# Patient Record
Sex: Female | Born: 1970
Health system: Southern US, Community
[De-identification: ages and names within clinical notes are randomized; demographics above are authoritative.]

## PROBLEM LIST (undated history)

## (undated) DIAGNOSIS — J40 Bronchitis, not specified as acute or chronic: Secondary | ICD-10-CM

## (undated) DIAGNOSIS — J45909 Unspecified asthma, uncomplicated: Secondary | ICD-10-CM

## (undated) DIAGNOSIS — E785 Hyperlipidemia, unspecified: Secondary | ICD-10-CM

## (undated) DIAGNOSIS — E041 Nontoxic single thyroid nodule: Secondary | ICD-10-CM

## (undated) HISTORY — DX: Nontoxic single thyroid nodule: E04.1

## (undated) HISTORY — DX: Hyperlipidemia, unspecified: E78.5

## (undated) HISTORY — DX: Bronchitis, not specified as acute or chronic: J40

## (undated) HISTORY — DX: Unspecified asthma, uncomplicated: J45.909

---

## 2001-01-10 HISTORY — PX: CHOLECYSTECTOMY: SHX55

## 2012-03-08 LAB — HM PAP SMEAR

## 2013-02-08 LAB — HM PAP SMEAR: HM Pap smear: NORMAL

## 2013-03-06 LAB — HEPATIC FUNCTION PANEL
ALK PHOS: 85 U/L (ref 25–125)
ALT: 18 U/L (ref 7–35)
AST: 19 U/L (ref 13–35)

## 2013-03-06 LAB — CBC AND DIFFERENTIAL
HEMOGLOBIN: 13.4 g/dL (ref 12.0–16.0)
Platelets: 310 10*3/uL (ref 150–399)
WBC: 7.8 10*3/mL

## 2013-03-06 LAB — LIPID PANEL
Cholesterol: 202 mg/dL — AB (ref 0–200)
HDL: 52 mg/dL (ref 35–70)
LDL Cholesterol: 139 mg/dL
TRIGLYCERIDES: 57 mg/dL (ref 40–160)

## 2013-03-06 LAB — BASIC METABOLIC PANEL
BUN: 14 mg/dL (ref 4–21)
Creatinine: 0.9 mg/dL (ref 0.5–1.1)
Glucose: 92 mg/dL
Potassium: 4.5 mmol/L (ref 3.4–5.3)
Sodium: 145 mmol/L (ref 137–147)

## 2013-07-03 DIAGNOSIS — Z8249 Family history of ischemic heart disease and other diseases of the circulatory system: Secondary | ICD-10-CM | POA: Insufficient documentation

## 2013-12-10 ENCOUNTER — Emergency Department
Admission: EM | Admit: 2013-12-10 | Discharge: 2013-12-10 | Disposition: A | Discharge status: Other Acute Inpatient Hospital | Payer: BC Managed Care – PPO | Source: Home / Self Care | Attending: Emergency Medicine | Admitting: Emergency Medicine

## 2013-12-10 DIAGNOSIS — G4453 Primary thunderclap headache: Secondary | ICD-10-CM

## 2013-12-10 NOTE — ED Provider Notes (Signed)
CSN: [id number]     Arrival date & time 12/10/13  2008 History   None    Chief Complaint  Patient presents with  . Headache   [facility] at 7:59 PM Patient is a 43 y.o. female presenting with headaches. The history is provided by the patient and the spouse.  Headache Pain location:  Generalized (And occipital) Quality:  Sharp (With deep pressure sensation diffusely) Radiates to:  Does not radiate Severity currently:  10/10 Onset quality:  Sudden Duration:  8 hours Timing:  Constant Progression:  Worsening Chronicity:  New Similar to prior headaches: no (She states this is the worst headache of her life)   Context: not exposure to bright light   Context comment:  Recalls no injury Ineffective treatments: Ibuprofen 800 mg. Associated symptoms: blurred vision (Right eye, intermittently), dizziness, eye pain, numbness and visual change   Associated symptoms: no abdominal pain, no ear pain, no fever, no focal weakness, no seizures, no sore throat, no syncope and no vomiting    She denies history of migraines or chronic headaches. She states this is the worst headache of her life, and she is very worried about this being something severe.   No past medical history on file. No past surgical history on file. No family history on file. History  Substance Use Topics  . Smoking status: Not on file  . Smokeless tobacco: Not on file  . Alcohol Use: Not on file   denies tobacco or alcohol use OB History    No data available     Review of Systems  Constitutional: Negative for fever.  HENT: Negative for ear pain and sore throat.   Eyes: Positive for blurred vision (Right eye, intermittently) and pain.  Cardiovascular: Negative for syncope.  Gastrointestinal: Negative for vomiting and abdominal pain.  Neurological: Positive for dizziness, numbness and headaches. Negative for focal weakness and seizures.    Allergies  Codeine phosphate  Home  Medications   Prior to Admission medications   Not on File   BP 128/80 mmHg  Pulse 96  Temp(Src) 97.6 F (36.4 C) (Oral)  Wt 153 lb (69.4 kg)  SpO2 100% Physical Exam  Constitutional: She is oriented to person, place, and time. She appears well-developed and well-nourished. She appears distressed (From severe headache).  She is alert. She is in severe pain from headache and appears very worried. She can ambulate on her own. Here with husband  HENT:  Head: Normocephalic and atraumatic.  Mouth/Throat: Oropharynx is clear and moist.  Head without any specific point tenderness that she points to her entire head and especially occipital area which has the severe pain.  Eyes: Pupils are equal, round, and reactive to light. No scleral icterus.  Neck: Normal range of motion. Neck supple.  Cardiovascular: Normal rate and regular rhythm.   Pulmonary/Chest: Effort normal.  Abdominal: She exhibits no distension.  Musculoskeletal: Normal range of motion. She exhibits no edema.  Neurological: She is alert and oriented to person, place, and time. No cranial nerve deficit.  Skin: Skin is warm and dry. No rash noted.  Psychiatric: She has a normal mood and affect.   Vision each eye is grossly intact. DTRs upper extremities and sensation upper extremities grossly intact and equal ED Course  Procedures (including critical care time) Labs Review Labs Reviewed - No data to display  Imaging Review No results found.   MDM   1. Primary thunderclap headache    unknown cause for severe  acute headache today, the worst headache of her life, without prior history of chronic headaches or migraines. Associated with intermittent vision abnormality and lightheadedness.  Need to rule out intracranial bleed or other acute intracranial abnormality. I explained this to patient and husband. I explained that we don't have CT available here in urgent care at this time, and she likely will need some type of stat  head imaging tonight, such as CT of head, to rule out acute intracranial abnormality.  Per my advice, husband to drive patient to the nearest emergency room (which [facility]) now for further evaluation and treatment. Patient and husband voiced understanding and agreement with these plans.  We called ahead and spoke to the triage nurse at [facility] and explained the situation and the above concerns, and the need for stat evaluation and treatment there.  Lajean Manesavid Massey, MD 12/10/13 [id number]

## 2013-12-10 NOTE — ED Notes (Signed)
Patient c/o headache that started today approx 12 o'clock. States that she took ibuprofen 800 mg today at 6:15 pm and it has not helped

## 2013-12-12 ENCOUNTER — Telehealth: Payer: Self-pay | Admitting: Emergency Medicine

## 2014-02-10 LAB — HM MAMMOGRAPHY: HM Mammogram: NORMAL

## 2014-03-12 LAB — HM PAP SMEAR

## 2014-04-03 LAB — CHG IRON BINDING TEST: Total Iron Binding Capacity: 369

## 2014-04-03 LAB — IRON: Iron: 90

## 2014-04-03 LAB — CBC AND DIFFERENTIAL
HEMOGLOBIN: 12.2 g/dL (ref 12.0–16.0)
PLATELETS: 337 10*3/uL (ref 150–399)
WBC: 8 10*3/mL

## 2014-04-03 LAB — FERRITIN: FERRITIN: 25

## 2014-04-03 LAB — TRANSFERRIN: TRANSFERRIN: 296

## 2014-04-21 ENCOUNTER — Telehealth: Payer: Self-pay | Admitting: Family Medicine

## 2014-04-21 NOTE — Telephone Encounter (Signed)
FYI. Pt's daughter  will be coming in next year, she is currently being seen by her Pediatric doctor. Dr Linford ArnoldMetheney has given the ok.

## 2014-06-05 ENCOUNTER — Encounter: Payer: Self-pay | Admitting: Family Medicine

## 2014-06-05 ENCOUNTER — Ambulatory Visit (INDEPENDENT_AMBULATORY_CARE_PROVIDER_SITE_OTHER): Payer: BLUE CROSS/BLUE SHIELD | Admitting: Family Medicine

## 2014-06-05 VITALS — BP 107/65 | HR 89 | Ht 62.0 in | Wt 139.0 lb

## 2014-06-05 DIAGNOSIS — G4700 Insomnia, unspecified: Secondary | ICD-10-CM | POA: Diagnosis not present

## 2014-06-05 DIAGNOSIS — H9312 Tinnitus, left ear: Secondary | ICD-10-CM

## 2014-06-05 DIAGNOSIS — D509 Iron deficiency anemia, unspecified: Secondary | ICD-10-CM | POA: Insufficient documentation

## 2014-06-05 DIAGNOSIS — H9319 Tinnitus, unspecified ear: Secondary | ICD-10-CM | POA: Insufficient documentation

## 2014-06-05 DIAGNOSIS — E049 Nontoxic goiter, unspecified: Secondary | ICD-10-CM

## 2014-06-05 NOTE — Progress Notes (Signed)
Subjective:    Patient ID: Sheryl Taylor, female    DOB: [birth year], 44 y.o.   MRN: [id number]  HPI  patient is a 44 year old female with no significant past medical history who is a Geologist, engineering for the [employer]. She comes in today to establish care. She does not take any prescription medications. She does see a GYN and reports that her mammogram and Pap smear are up-to-date. She also reports that she had blood work that included a cholesterol in March of this year.   she comes in today complaining of a sore throat that started on Monday. She says is actually been a little bit better today. She did not have any other cold symptoms with it but did notice a lymph node that was swollen on the right side of the neck. She came in because she just wanted to make sure that it was okay. She's had ongoing issues with ear ringing since November. She is Re: Been to the audiologist. They told her that there was nothing really they can do to correct it. About a week or so ago she decided to start a B complex. A friend had told her that it might help with her ear ringing. She said she was doing and took a until a few days ago when she felt like ear was bothering her more. Instead of being more of a constant ringing it sounds more like a pulse or throbbing. She denies any recent onset of pain or drainage or fever.  Review of Systems  Constitutional: Negative for fever, diaphoresis and unexpected weight change.  HENT: Positive for tinnitus. Negative for hearing loss and rhinorrhea.   Eyes: Negative for visual disturbance.  Respiratory: Negative for cough and wheezing.   Cardiovascular: Negative for chest pain and palpitations.  Gastrointestinal: Negative for nausea, vomiting, diarrhea and blood in stool.  Genitourinary: Negative for vaginal bleeding, vaginal discharge and difficulty urinating.  Musculoskeletal: Negative for myalgias and arthralgias.  Skin: Negative for rash.   Neurological: Negative for headaches.  Hematological: Negative for adenopathy. Does not bruise/bleed easily.  Psychiatric/Behavioral: Negative for sleep disturbance and dysphoric mood. The patient is not nervous/anxious.    BP 107/65 mmHg  Pulse 89  Ht  (1.575 m)  Wt 139 lb (63.05 kg)  BMI 25.42 kg/m2    Allergies  Allergen Reactions  . Codeine Phosphate Itching  . Morphine And Related Swelling and Rash    No past medical history on file.  Past Surgical History  Procedure Laterality Date  . Cesarean section      x 2   . Cholecystectomy  2003    History   Social History  . Marital Status: Married    Spouse Name: Karleen Hampshire   . Number of Children: 2  . Years of Education: N/A   Occupational History  . teacher assistant     [employer]   Social History Main Topics  . Smoking status: Never Smoker   . Smokeless tobacco: Not on file  . Alcohol Use: No  . Drug Use: No  . Sexual Activity:    Partners: Male   Other Topics Concern  . 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 And has two children. She exercises 2 days a wk for an 1 hour.  Her sister died suddenty at 32 unknown cause and this makes her very anxious.  Family History  Problem Relation Age of Onset  . Lung cancer Father     lung (50)  . Heart failure Father     (41)  . Hyperlipidemia Father   . Breast cancer      great aunt  . Breast cancer      cousin.     Outpatient Encounter Prescriptions as of 06/05/2014  Medication Sig  . Vitamins-Lipotropics (LIPO-FLAVONOID PLUS PO) Take by mouth.   No facility-administered encounter medications on file as of 06/05/2014.           Objective:   Physical Exam  Constitutional: She is oriented to person, place, and time. She appears well-developed and well-nourished.  HENT:  Head: Normocephalic and atraumatic.  Right Ear: External ear normal.  Left Ear: External ear normal.   Nose: Nose normal.  Mouth/Throat: Oropharynx is clear and moist.  TMs and canals are clear. The light reflex is a little bit distorted on the left tympanic membrane. Otherwise normal. No sign of fluid erythema or drainage.  Eyes: Conjunctivae and EOM are normal. Pupils are equal, round, and reactive to light.  Neck: Neck supple. No thyromegaly present.  She does have a slightly enlarged anterior cervical lymph node on the right approximately 1 cm in size. It is smooth and firm but not hard.  Cardiovascular: Normal rate, regular rhythm and normal heart sounds.   Pulmonary/Chest: Effort normal and breath sounds normal. She has no wheezes.  Lymphadenopathy:    She has cervical adenopathy.  Neurological: She is alert and oriented to person, place, and time.  Skin: Skin is warm and dry.  Psychiatric: She has a normal mood and affect.          Assessment & Plan:  Tinnitus chronic but with change in type of tinnitus - tympanometry shows some negative pressure in the right ear which is the side where she has a swollen lymph node and a widened tympanogram on the left ear which is the side that she has an abnormal light reflex. We discussed treatment options. Since I see no sign of actual infection on exam I recommended nasal Sterritt spray which should help with the negative pressure in the right ear and possible effusion in the left ear. She has tried a Paediatric nursenasal Sterritt spray previously but didn't stick with it because she was worried that it might make her ear worse. I encouraged her to get back on it for a month. If she still having the change in the tinnitus in the left ear after a week on the nasal Sterritt spray the encourage her to come back in and let me just recheck the ear.  Right ant cerv LN - gave reassurance. Likely viral since had ST earlier this week. Should resolve within a month. Call if persists longer than that  Borderline thyromegaly.  We could certainly pursue this with an  ultrasound if she would like. We'll hold off now because of finances but could consider in the future. Just encourage her to keep an eye on the area.  Iron deficiency anemia-she's been on One-A-Day iron for several months and is due to repeat her level in June. She said she thought her last level was up to 25.  Offered tetanus vaccine but she declined today and said she will think about it in the future.

## 2014-06-06 ENCOUNTER — Ambulatory Visit: Payer: Self-pay | Admitting: Family Medicine

## 2014-06-12 ENCOUNTER — Ambulatory Visit (INDEPENDENT_AMBULATORY_CARE_PROVIDER_SITE_OTHER): Payer: BLUE CROSS/BLUE SHIELD

## 2014-06-12 DIAGNOSIS — E049 Nontoxic goiter, unspecified: Secondary | ICD-10-CM | POA: Diagnosis not present

## 2014-06-13 ENCOUNTER — Encounter: Payer: Self-pay | Admitting: Family Medicine

## 2014-06-13 ENCOUNTER — Other Ambulatory Visit: Payer: Self-pay | Admitting: *Deleted

## 2014-06-13 DIAGNOSIS — E049 Nontoxic goiter, unspecified: Secondary | ICD-10-CM

## 2014-06-13 LAB — TSH: TSH: 2.017 u[IU]/mL (ref 0.350–4.500)

## 2014-06-13 LAB — T4, FREE: Free T4: 0.94 ng/dL (ref 0.80–1.80)

## 2014-07-03 ENCOUNTER — Telehealth: Payer: Self-pay | Admitting: *Deleted

## 2014-07-03 ENCOUNTER — Other Ambulatory Visit: Payer: Self-pay | Admitting: *Deleted

## 2014-07-03 DIAGNOSIS — Z0000 Encounter for general adult medical examination without abnormal findings: Secondary | ICD-10-CM

## 2014-07-03 DIAGNOSIS — Z114 Encounter for screening for human immunodeficiency virus [HIV]: Secondary | ICD-10-CM

## 2014-07-03 NOTE — Telephone Encounter (Signed)
Pt called requesting that lab orders be placed prior to her CPE. Orders placed and faxed.Loralee Pacas [place]

## 2014-07-11 LAB — COMPLETE METABOLIC PANEL WITH GFR
ALK PHOS: 61 U/L (ref 39–117)
ALT: 12 U/L (ref 0–35)
AST: 14 U/L (ref 0–37)
Albumin: 4.1 g/dL (ref 3.5–5.2)
BILIRUBIN TOTAL: 0.5 mg/dL (ref 0.2–1.2)
BUN: 10 mg/dL (ref 6–23)
CALCIUM: 9.1 mg/dL (ref 8.4–10.5)
CO2: 27 mEq/L (ref 19–32)
Chloride: 104 mEq/L (ref 96–112)
Creat: 0.86 mg/dL (ref 0.50–1.10)
GFR, EST NON AFRICAN AMERICAN: 84 mL/min
GFR, Est African American: >89 mL/min
GLUCOSE: 95 mg/dL (ref 70–99)
POTASSIUM: 4.4 meq/L (ref 3.5–5.3)
SODIUM: 144 meq/L (ref 135–145)
TOTAL PROTEIN: 6.9 g/dL (ref 6.0–8.3)

## 2014-07-11 LAB — LIPID PANEL
CHOLESTEROL: 181 mg/dL (ref 0–200)
HDL: 56 mg/dL (ref >=46)
LDL Cholesterol: 114 mg/dL — ABNORMAL HIGH (ref 0–99)
TRIGLYCERIDES: 55 mg/dL (ref <150)
Total CHOL/HDL Ratio: 3.2 Ratio
VLDL: 11 mg/dL (ref 0–40)

## 2014-07-11 LAB — IRON: Iron: 92 ug/dL (ref 42–145)

## 2014-07-11 LAB — HIV ANTIBODY (ROUTINE TESTING W REFLEX): HIV: NONREACTIVE

## 2014-07-11 LAB — FERRITIN: FERRITIN: 43 ng/mL (ref 10–291)

## 2014-07-15 ENCOUNTER — Encounter: Payer: Self-pay | Admitting: Family Medicine

## 2014-07-15 ENCOUNTER — Ambulatory Visit (INDEPENDENT_AMBULATORY_CARE_PROVIDER_SITE_OTHER): Payer: BLUE CROSS/BLUE SHIELD | Admitting: Family Medicine

## 2014-07-15 VITALS — BP 98/60 | HR 80 | Wt 140.0 lb

## 2014-07-15 DIAGNOSIS — Z0000 Encounter for general adult medical examination without abnormal findings: Secondary | ICD-10-CM

## 2014-07-15 DIAGNOSIS — Z0189 Encounter for other specified special examinations: Secondary | ICD-10-CM

## 2014-07-15 NOTE — Progress Notes (Signed)
  Subjective:     Sheryl Taylor is a 44 y.o. female and is here for a comprehensive physical exam. The patient reports no problems.  Had mammogram with [facility] at work.  She works out twice per week.    History   Social History  . Marital Status: Married    Spouse Name: Karleen HampshireSpencer   . Number of Children: 2  . Years of Education: N/A   Occupational History  . teacher assistant     [employer]   Social History Main Topics  . Smoking status: Never Smoker   . Smokeless tobacco: Not on file  . Alcohol Use: No  . Drug Use: No  . Sexual Activity:    Partners: Male   Other Topics Concern  . Not on file   Social History Narrative   Patient works as a Research officer, trade unionTeachers Assistant with [employer] she is married to [place]. And has two children. She exercises 2 days a wk for an 1 hour.  Her sister died suddenty at 5236 unknown cause and this makes her very anxious.     Health Maintenance  Topic Date Due  . PAP SMEAR  07/17/1990  . TETANUS/TDAP  06/05/2015 (Originally 07/17/1991)  . INFLUENZA VACCINE  08/11/2014  . HIV Screening  Completed    The following portions of the patient's history were reviewed and updated as appropriate: allergies, current medications, past family history, past medical history, past social history, past surgical history and problem list.  Review of Systems A comprehensive review of systems was negative.   Objective:    There were no vitals taken for this visit. General appearance: alert, cooperative and appears stated age Head: Normocephalic, without obvious abnormality, atraumatic Eyes: conj clear, EOMI, PEERLA Ears: normal TM's and external ear canals both ears Nose: Nares normal. Septum midline. Mucosa normal. No drainage or sinus tenderness. Throat: lips, mucosa, and tongue normal; teeth and gums normal Neck: no adenopathy, no carotid bruit, no JVD, supple, symmetrical, trachea midline and thyroid not enlarged, symmetric, no tenderness/mass/nodules Back: symmetric, no  curvature. ROM normal. No CVA tenderness. Lungs: clear to auscultation bilaterally Breasts: normal appearance, no masses or tenderness Heart: regular rate and rhythm, S1, S2 normal, no murmur, click, rub or gallop Abdomen: soft, non-tender; bowel sounds normal; no masses,  no organomegaly Extremities: extremities normal, atraumatic, no cyanosis or edema Pulses: 2+ and symmetric Skin: Skin color, texture, turgor normal. No rashes or lesions Lymph nodes: Cervical, supraclavicular, and axillary nodes normal. Neurologic: Alert and oriented X 3, normal strength and tone. Normal symmetric reflexes. Normal coordination and gait    Assessment:    Healthy female exam.      Plan:     See After Visit Summary for Counseling Recommendations   Keep up a regular exercise program and make sure you are eating a healthy diet Try to eat 4 servings of dairy a day, or if you are lactose intolerant take a calcium with vitamin D daily.  Your vaccines are up to date.   Mammogram is UTD.   Will call to get recent pap report from her gyn.

## 2014-07-15 NOTE — Patient Instructions (Signed)
Keep up a regular exercise program and make sure you are eating a healthy diet Try to eat 4 servings of dairy a day, or if you are lactose intolerant take a calcium with vitamin D daily.  Your vaccines are up to date.   

## 2014-08-05 ENCOUNTER — Encounter: Payer: Self-pay | Admitting: Family Medicine

## 2014-08-06 ENCOUNTER — Encounter: Payer: Self-pay | Admitting: Family Medicine

## 2014-08-10 ENCOUNTER — Encounter: Payer: Self-pay | Admitting: Family Medicine

## 2014-09-08 ENCOUNTER — Other Ambulatory Visit: Payer: Self-pay | Admitting: Family Medicine

## 2014-09-26 ENCOUNTER — Encounter: Payer: Self-pay | Admitting: Family Medicine

## 2014-09-26 ENCOUNTER — Ambulatory Visit (INDEPENDENT_AMBULATORY_CARE_PROVIDER_SITE_OTHER): Payer: BLUE CROSS/BLUE SHIELD | Admitting: Family Medicine

## 2014-09-26 ENCOUNTER — Ambulatory Visit (INDEPENDENT_AMBULATORY_CARE_PROVIDER_SITE_OTHER): Payer: BLUE CROSS/BLUE SHIELD

## 2014-09-26 VITALS — BP 105/64 | HR 73 | Wt 142.0 lb

## 2014-09-26 DIAGNOSIS — S338XXA Sprain of other parts of lumbar spine and pelvis, initial encounter: Secondary | ICD-10-CM

## 2014-09-26 DIAGNOSIS — M5136 Other intervertebral disc degeneration, lumbar region: Secondary | ICD-10-CM | POA: Insufficient documentation

## 2014-09-26 DIAGNOSIS — S76012A Strain of muscle, fascia and tendon of left hip, initial encounter: Secondary | ICD-10-CM | POA: Diagnosis not present

## 2014-09-26 DIAGNOSIS — M51369 Other intervertebral disc degeneration, lumbar region without mention of lumbar back pain or lower extremity pain: Secondary | ICD-10-CM | POA: Insufficient documentation

## 2014-09-26 DIAGNOSIS — M25552 Pain in left hip: Secondary | ICD-10-CM | POA: Diagnosis not present

## 2014-09-26 DIAGNOSIS — M5126 Other intervertebral disc displacement, lumbar region: Secondary | ICD-10-CM

## 2014-09-26 DIAGNOSIS — S39012A Strain of muscle, fascia and tendon of lower back, initial encounter: Secondary | ICD-10-CM

## 2014-09-26 MED ORDER — CYCLOBENZAPRINE HCL 10 MG PO TABS: 10.0000 mg | ORAL_TABLET | Freq: Three times a day (TID) | ORAL | Status: DC | PRN

## 2014-09-26 NOTE — Patient Instructions (Signed)
Thank you for coming in today. Return in 2 weeks if not better.  Attend physical therapy.  Come back or go to the emergency room if you notice new weakness new numbness problems walking or bowel or bladder problems. ' Lumbosacral Strain Lumbosacral strain is a strain of any of the parts that make up your lumbosacral vertebrae. Your lumbosacral vertebrae are the bones that make up the lower third of your backbone. Your lumbosacral vertebrae are held together by muscles and tough, fibrous tissue (ligaments).  CAUSES  A sudden blow to your back can cause lumbosacral strain. Also, anything that causes an excessive stretch of the muscles in the low back can cause this strain. This is typically seen when people exert themselves strenuously, fall, lift heavy objects, bend, or crouch repeatedly. RISK FACTORS  Physically demanding work.  Participation in pushing or pulling sports or sports that require a sudden twist of the back (tennis, golf, baseball).  Weight lifting.  Excessive lower back curvature.  Forward-tilted pelvis.  Weak back or abdominal muscles or both.  Tight hamstrings. SIGNS AND SYMPTOMS  Lumbosacral strain may cause pain in the area of your injury or pain that moves (radiates) down your leg.  DIAGNOSIS Your health care provider can often diagnose lumbosacral strain through a physical exam. In some cases, you may need tests such as X-ray exams.  TREATMENT  Treatment for your lower back injury depends on many factors that your clinician will have to evaluate. However, most treatment will include the use of anti-inflammatory medicines. HOME CARE INSTRUCTIONS   Avoid hard physical activities (tennis, racquetball, waterskiing) if you are not in proper physical condition for it. This may aggravate or create problems.  If you have a back problem, avoid sports requiring sudden body movements. Swimming and walking are generally safer activities.  Maintain good posture.  Maintain  a healthy weight.  For acute conditions, you may put ice on the injured area.  Put ice in a plastic bag.  Place a towel between your skin and the bag.  Leave the ice on for 20 minutes, 2-3 times a day.  When the low back starts healing, stretching and strengthening exercises may be recommended. SEEK MEDICAL CARE IF:  Your back pain is getting worse.  You experience severe back pain not relieved with medicines. SEEK IMMEDIATE MEDICAL CARE IF:   You have numbness, tingling, weakness, or problems with the use of your arms or legs.  There is a change in bowel or bladder control.  You have increasing pain in any area of the body, including your belly (abdomen).  You notice shortness of breath, dizziness, or feel faint.  You feel sick to your stomach (nauseous), are throwing up (vomiting), or become sweaty.  You notice discoloration of your toes or legs, or your feet get very cold. MAKE SURE YOU:   Understand these instructions.  Will watch your condition.  Will get help right away if you are not doing well or get worse. Document Released: 10/06/2004 Document Revised: 01/01/2013 Document Reviewed: 08/15/2012 [facility] Patient Information 2015 [place], [place]. This information is not intended to replace advice given to you by your health care provider. Make sure you discuss any questions you have with your health care provider.

## 2014-09-26 NOTE — Assessment & Plan Note (Signed)
Plan for physical therapy NSAIDs and Flexeril. Return in 2 weeks. Work note provided.  

## 2014-09-26 NOTE — Progress Notes (Signed)
Sheryl Taylor is a 44 y.o. female who presents to [facility]  today for back pain. Patient notes severe left-sided low back pain. She's had intermittent pain in her low back for the last several years. She denies any specific injury. The pain started yesterday when she was crouched down to care for her child. When she stood up she had severe onset of low back pain. The pain does not radiate. No weakness or numbness bowel bladder dysfunction. She's tried some ibuprofen which helped only a little. The pain is a little bit better today than it was yesterday. No fevers chills nausea vomiting or diarrhea.  Additionally patient has some pain in her left groin. She notes this is tender when she touches that she denies any weakness or numbness. Pain is worse with ambulation.  No past medical history on file. Past Surgical History  Procedure Laterality Date  . Cesarean section      x 2   . Cholecystectomy  2003   Social History  Substance Use Topics  . Smoking status: Never Smoker   . Smokeless tobacco: Not on file  . Alcohol Use: No   family history includes Breast cancer in her maternal aunt and other family members; Heart attack in her paternal uncle; Heart failure in her father; Hyperlipidemia in her father; Lung cancer in her father.  ROS as above Medications: Current Outpatient Prescriptions  Medication Sig Dispense Refill  . ibuprofen (ADVIL,MOTRIN) 800 MG tablet TAKE 1 TABLET (800 MG TOTAL) BY MOUTH EVERY 8 (EIGHT) HOURS AS NEEDED FOR PAIN. 90 tablet 1  . cyclobenzaprine (FLEXERIL) 10 MG tablet Take 1 tablet (10 mg total) by mouth 3 (three) times daily as needed for muscle spasms. 30 tablet 0   No current facility-administered medications for this visit.   Allergies  Allergen Reactions  . Codeine Phosphate Itching  . Morphine And Related Swelling and Rash     Exam:  BP 105/64 mmHg  Pulse 73  Wt 142 lb (64.411 kg) Gen: Well NAD HEENT: EOMI,   MMM Lungs: Normal work of breathing. CTABL Heart: RRR no MRG Abd: NABS, Soft. Nondistended, Nontender Exts: Brisk capillary refill, warm and well perfused.  Back: Nontender to midline. Tender palpation bilateral lumbar paraspinal muscles. Tender to palpation left SI joint. Low back range of motion is intact but pain with flexion. Negative straight leg raise test and Faber test bilaterally. Lower extremity Strength and reflexes and sensation are equal and normal throughout. Hips: Normal appearing normal motion bilaterally. Mildly tender to touch left hip adductor group  Pain with resisted hip adduction  X-rays pending  No results found for this or any previous visit (from the past 24 hour(s)). No results found.   Please see individual assessment and plan sections.

## 2014-09-26 NOTE — Assessment & Plan Note (Signed)
Plan for physical therapy NSAIDs and Flexeril. Return in 2 weeks. Work note provided.

## 2014-09-26 NOTE — Progress Notes (Signed)
Quick Note:  Xray shows some old arthritis changes. ______

## 2014-10-02 ENCOUNTER — Ambulatory Visit (INDEPENDENT_AMBULATORY_CARE_PROVIDER_SITE_OTHER): Payer: BLUE CROSS/BLUE SHIELD | Admitting: Rehabilitative and Restorative Service Providers"

## 2014-10-02 ENCOUNTER — Encounter: Payer: Self-pay | Admitting: Rehabilitative and Restorative Service Providers"

## 2014-10-02 DIAGNOSIS — M545 Low back pain, unspecified: Secondary | ICD-10-CM

## 2014-10-02 DIAGNOSIS — R531 Weakness: Secondary | ICD-10-CM

## 2014-10-02 DIAGNOSIS — R29898 Other symptoms and signs involving the musculoskeletal system: Secondary | ICD-10-CM

## 2014-10-02 DIAGNOSIS — Z7409 Other reduced mobility: Secondary | ICD-10-CM

## 2014-10-02 DIAGNOSIS — M2560 Stiffness of unspecified joint, not elsewhere classified: Secondary | ICD-10-CM

## 2014-10-02 DIAGNOSIS — M623 Immobility syndrome (paraplegic): Secondary | ICD-10-CM | POA: Diagnosis not present

## 2014-10-02 DIAGNOSIS — R6889 Other general symptoms and signs: Secondary | ICD-10-CM

## 2014-10-02 DIAGNOSIS — M6289 Other specified disorders of muscle: Secondary | ICD-10-CM | POA: Diagnosis not present

## 2014-10-02 NOTE — Therapy (Addendum)
[place], [place], [address] Phone: [phone]   Fax:  [phone]  Physical Therapy Evaluation  Patient Details  Name: Sheryl Taylor MRN: [id number] Date of Birth: 19-Dec-1970 Referring Provider:  Gregor Hams, MD  Encounter Date: 10/02/2014      PT End of Session - 10/02/14 1728    Visit Number 1   Number of Visits 12   Date for PT Re-Evaluation 11/13/14   PT Start Time 0416   PT Stop Time 0509   PT Time Calculation (min) 53 min   Activity Tolerance Patient tolerated treatment well  pain free post treatment      History reviewed. No pertinent past medical history.  Past Surgical History  Procedure Laterality Date  . Cesarean section      x 2   . Cholecystectomy  2003    There were no vitals filed for this visit.  Visit Diagnosis:  Midline low back pain without sciatica - Plan: PT plan of care cert/re-cert  Stiffness due to immobility - Plan: PT plan of care cert/re-cert  Weakness of both hips - Plan: PT plan of care cert/re-cert  Decreased strength, endurance, and mobility - Plan: PT plan of care cert/re-cert      Subjective Assessment - 10/02/14 1617    Subjective Patient reports onset of LBP pain several years ago with symptoms sometimes worse than others. Pain started 2nd week of August, 2016.  Thursday pt had a sudden onset of pain limiting activity/walking.    Pertinent History LBP x 10 years ago following a fall landing onto Lt side. She has had other falls since that time - treated symptomatically with heat and OTC meds with partial resolution on a temporary basis.    How long can you sit comfortably? 2-3 hours on heating pad with pillow rolled up behind her back - hips hurt when sitting at times   How long can you stand comfortably? 10 30 min    How long can you walk comfortably? 3-4 hours    Diagnostic tests xrays showed arthritis in spine   Patient Stated Goals out of  pain    Currently in Pain? Yes   Pain Score 5    Pain Location Back   Pain Orientation Left;Lower   Pain Descriptors / Indicators Sharp   Pain Radiating Towards radiating intermittently into both hips    Pain Onset More than a month ago   Pain Frequency Intermittent   Aggravating Factors  unknown    Pain Relieving Factors siting with towel or pillow rolled up behind her back with heating pad             [facility] PT Assessment - 10/02/14 0001    Assessment   Medical Diagnosis lumbosacral pain    Onset Date/Surgical Date 08/26/14   Hand Dominance Right   Next MD Visit no appt scheduled   Prior Therapy none   Precautions   Precautions None   Balance Screen   Has the patient fallen in the past 6 months No   Has the patient had a decrease in activity level because of a fear of falling?  No   Is the patient reluctant to leave their home because of a fear of falling?  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, walking, reaching, squatting, bending, very little sitting   Leisure household chores; church; exercise class Mon/Thur x 6 months 1 hour - aerobic type exercise    Observation/Other Assessments   Focus on Therapeutic Outcomes (FOTO)  38% limitation   Sensation   Additional Comments WFL's    Posture/Postural Control   Posture Comments head forward shoulder rounded increased thoracic kyphosis increased lumbar lordosis    AROM   Lumbar Flexion 50%  pain bilat sacral area    Lumbar Extension 70%  Lt Sacral area   Lumbar - Right Side Bend 75%   Lumbar - Left Side Bend 75%   Lumbar - Right Rotation 25%   Lumbar - Left Rotation 20%   Strength   Overall Strength Comments 5/5 throughout bilat LE's except hip extension 4+/5   Flexibility   Hamstrings Lt 52 deg; Rt 55 deg  pain in  LB and muscle tightness HS same side    Quadriceps tight Lt 6 in Rt 5 in  heel to buttocks   ITB tight bilat    Piriformis tight Rt    Palpation   Spinal mobility pain with CPA mobs L5/S1; L4/5; L3/4   SI assessment  pain Lt   Palpation comment tight bilat lumbar paraspinals; lats; QL into piriformis/hip abductors Lt > Rt                    [facility] Adult PT Treatment/Exercise - 10/02/14 0001    Self-Care   Self-Care Other Self-Care Comments   Other Self-Care Comments  Pt instructed in log roll supine to/from sit.  Pt returned demo.    Exercises   Exercises Lumbar   Lumbar Exercises: Stretches   Passive Hamstring Stretch 2 reps;30 seconds  each leg; with strap   Lumbar Exercises: Supine   AB Set Limitations 3 part core : 5 sec hold x 5 reps    Lumbar Exercises: Quadruped   Madcat/Old Horse 10 reps   Other Quadruped Lumbar Exercises childs pose x 25 sec; with lateral trunk flexion x 30 sec each side.    Modalities   Modalities Electrical Stimulation;Moist Heat   Moist Heat Therapy   Number Minutes Moist Heat 15 Minutes   Moist Heat Location Lumbar Spine   Electrical Stimulation   Electrical Stimulation Location Lumbar paraspinals    Electrical Stimulation Action IFC   Electrical Stimulation Parameters to tolerance x 15 min    Electrical Stimulation Goals Pain                PT Education - 10/02/14 1656    Education provided Yes   Education Details HEP, self care: log roll.    Person(s) Educated Patient   Methods Explanation;Handout;Demonstration;Tactile cues;Verbal cues   Comprehension Verbalized understanding;Returned demonstration;Verbal cues required;Tactile cues required             PT Long Term Goals - 10/02/14 1735    PT LONG TERM GOAL #1   Title Patient I in HEP 11/13/14   Time 6   Period Weeks   Status New   PT LONG TERM GOAL #2   Title Increase lumbar ROM by 10-20% 11/13/14   Time 6   Period Weeks   Status New   PT LONG TERM GOAL  #3   Title Patient to tolerate standing for 30-60 min 11/13/14   Time 6   Period Weeks   Status New   PT LONG TERM GOAL #4  Title Patient verbalizes changes in sleeping position and sitting position for home 11/13/14   Time 6   Period Weeks   Status New   PT LONG TERM GOAL #5   Title Improve FOTO to </= 30% limitation 11/13/14   Time 6   Period Weeks   Status New               Plan - 10/02/14 1729    Clinical Impression Statement Patient presents with flare up of LBP which has been present for the past several years on an intermittent basis. Patient has poor posture and alignment; limited trunk mobility; poor core strength; decreased LE strength; limited activity level and pain on a constant basis. She will benefiit from PT to address problems identified and improve functional level in an effort to manage back pain and episodis flare ups of symptoms. 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 Pt was advised to change sleeping position and offered suggestions in how to do so.    Pt will benefit from skilled therapeutic intervention in order to improve on the following deficits Postural dysfunction;Decreased range of motion;Decreased mobility;Decreased strength;Decreased activity tolerance;Decreased endurance;Pain   Rehab Potential Good   PT Frequency 1x / week   PT Duration 6 weeks   PT Treatment/Interventions Patient/family education;ADLs/Self Care Home Management;Therapeutic exercise;Therapeutic activities;Cryotherapy;Electrical Stimulation;Moist Heat;Ultrasound;Manual techniques;Dry needling   PT Next Visit Plan progress with stretching for lumbar and hip musculature; improve core stability and progress with core stabilization; spine education    PT Home Exercise Plan HEP   Consulted and Agree with Plan of Care Patient         Problem List Patient Active Problem List    Diagnosis Date Noted  . Lumbosacral strain 09/26/2014  . Strain of left hip adductor muscle 09/26/2014  . Tinnitus 06/05/2014  . Anemia, iron deficiency 06/05/2014  . Insomnia 06/05/2014    Sheryl Taylor Nilda Simmer PT, MPH 10/02/2014, 5:41 PM  [facility] [place], [place], [address] Phone: [phone]   Fax:  [phone]    PHYSICAL THERAPY DISCHARGE SUMMARY  Visits from Start of Care: Eval only  Current functional level related to goals / functional outcomes: unchanged   Remaining deficits: unchanged   Education / Equipment: HEP  Plan: Patient agrees to discharge.  Patient goals were not met. Patient is being discharged due to not returning since the last visit.  ?????     Natelie Ostrosky P. Helene Kelp PT, MPH 11/18/2014 8:40 AM

## 2014-10-02 NOTE — Patient Instructions (Signed)
Abdominal Bracing With Pelvic Floor (Hook-Lying)  - 3 part core   With neutral spine, tighten pelvic floor and abdominals.  Hold 5-10 sec. Repeat _10__ times. Do _1__ times a day.  Cat / Cow Flow   Inhale, press spine toward ceiling like a Halloween cat- hold for 5 seconds. Keeping strength in arms and abdominals, exhale to soften spine through neutral and into cow pose. Open chest and arch back. Initiate movement between cat and cow at tailbone, one vertebrae at a time. Repeat __10__ times.  BACK: Child's Pose (Sciatica)   Sit in knee-chest position and reach arms forward. Separate knees for comfort. Hold position for _5-10__ breaths ~ 30 seconds.  Can walk hands to one side and hold for 5 breaths.  Repeat __2_ times. Do __1-2_ times per day.   Hamstring Step 3   Left leg in maximal straight leg raise, heel at maximal stretch, straighten knee further by tightening knee cap. Warning: Intense stretch. Stay within tolerance. Hold _30-60__ seconds. Relax knee cap only. Repeat _2__ times.  [facility] [address] [place], [place] [address]  [phone] (office) [phone] (fax)

## 2014-10-13 ENCOUNTER — Telehealth: Payer: Self-pay

## 2014-10-13 NOTE — Telephone Encounter (Signed)
Patient called and left a message stating the flexeril is not helping with the pain. Please advise.

## 2014-10-14 NOTE — Telephone Encounter (Signed)
Left message advising of recommendations.  

## 2014-10-14 NOTE — Telephone Encounter (Signed)
Please advise pt to return for f/u

## 2014-10-28 ENCOUNTER — Ambulatory Visit: Payer: BLUE CROSS/BLUE SHIELD | Admitting: Family Medicine

## 2014-10-31 ENCOUNTER — Ambulatory Visit (INDEPENDENT_AMBULATORY_CARE_PROVIDER_SITE_OTHER): Payer: BLUE CROSS/BLUE SHIELD | Admitting: Family Medicine

## 2014-10-31 ENCOUNTER — Encounter: Payer: Self-pay | Admitting: Family Medicine

## 2014-10-31 VITALS — BP 117/65 | HR 82 | Wt 143.0 lb

## 2014-10-31 DIAGNOSIS — J329 Chronic sinusitis, unspecified: Secondary | ICD-10-CM | POA: Insufficient documentation

## 2014-10-31 DIAGNOSIS — S338XXD Sprain of other parts of lumbar spine and pelvis, subsequent encounter: Secondary | ICD-10-CM

## 2014-10-31 DIAGNOSIS — J0100 Acute maxillary sinusitis, unspecified: Secondary | ICD-10-CM

## 2014-10-31 DIAGNOSIS — S39012D Strain of muscle, fascia and tendon of lower back, subsequent encounter: Secondary | ICD-10-CM

## 2014-10-31 MED ORDER — IPRATROPIUM BROMIDE 0.06 % NA SOLN: 2.0000 | Freq: Four times a day (QID) | NASAL | Status: DC

## 2014-10-31 MED ORDER — AMOXICILLIN-POT CLAVULANATE 875-125 MG PO TABS: 1.0000 | ORAL_TABLET | Freq: Two times a day (BID) | ORAL | Status: DC

## 2014-10-31 NOTE — Patient Instructions (Signed)
Thank you for coming in today. Continue PT.  Recheck in 1 month.  Take Augmentin and nasal spray.  Call or go to the emergency room if you get worse, have trouble breathing, have chest pains, or palpitations.  Come back or go to the emergency room if you notice new weakness new numbness problems walking or bowel or bladder problems.  Sinusitis, Adult Sinusitis is redness, soreness, and inflammation of the paranasal sinuses. Paranasal sinuses are air pockets within the bones of your face. They are located beneath your eyes, in the middle of your forehead, and above your eyes. In healthy paranasal sinuses, mucus is able to drain out, and air is able to circulate through them by way of your nose. However, when your paranasal sinuses are inflamed, mucus and air can become trapped. This can allow bacteria and other germs to grow and cause infection. Sinusitis can develop quickly and last only a short time (acute) or continue over a long period (chronic). Sinusitis that lasts for more than 12 weeks is considered chronic. CAUSES Causes of sinusitis include:  Allergies.  Structural abnormalities, such as displacement of the cartilage that separates your nostrils (deviated septum), which can decrease the air flow through your nose and sinuses and affect sinus drainage.  Functional abnormalities, such as when the small hairs (cilia) that line your sinuses and help remove mucus do not work properly or are not present. SIGNS AND SYMPTOMS Symptoms of acute and chronic sinusitis are the same. The primary symptoms are pain and pressure around the affected sinuses. Other symptoms include:  Upper toothache.  Earache.  Headache.  Bad breath.  Decreased sense of smell and taste.  A cough, which worsens when you are lying flat.  Fatigue.  Fever.  Thick drainage from your nose, which often is green and may contain pus (purulent).  Swelling and warmth over the affected sinuses. DIAGNOSIS Your  health care provider will perform a physical exam. During your exam, your health care provider may perform any of the following to help determine if you have acute sinusitis or chronic sinusitis:  Look in your nose for signs of abnormal growths in your nostrils (nasal polyps).  Tap over the affected sinus to check for signs of infection.  View the inside of your sinuses using an imaging device that has a light attached (endoscope). If your health care provider suspects that you have chronic sinusitis, one or more of the following tests may be recommended:  Allergy tests.  Nasal culture. A sample of mucus is taken from your nose, sent to a lab, and screened for bacteria.  Nasal cytology. A sample of mucus is taken from your nose and examined by your health care provider to determine if your sinusitis is related to an allergy. TREATMENT Most cases of acute sinusitis are related to a viral infection and will resolve on their own within 10 days. Sometimes, medicines are prescribed to help relieve symptoms of both acute and chronic sinusitis. These may include pain medicines, decongestants, nasal steroid sprays, or saline sprays. However, for sinusitis related to a bacterial infection, your health care provider will prescribe antibiotic medicines. These are medicines that will help kill the bacteria causing the infection. Rarely, sinusitis is caused by a fungal infection. In these cases, your health care provider will prescribe antifungal medicine. For some cases of chronic sinusitis, surgery is needed. Generally, these are cases in which sinusitis recurs more than 3 times per year, despite other treatments. HOME CARE INSTRUCTIONS  Drink plenty  of water. Water helps thin the mucus so your sinuses can drain more easily.  Use a humidifier.  Inhale steam 3-4 times a day (for example, sit in the bathroom with the shower running).  Apply a warm, moist washcloth to your face 3-4 times a day, or as  directed by your health care provider.  Use saline nasal sprays to help moisten and clean your sinuses.  Take medicines only as directed by your health care provider.  If you were prescribed either an antibiotic or antifungal medicine, finish it all even if you start to feel better. SEEK IMMEDIATE MEDICAL CARE IF:  You have increasing pain or severe headaches.  You have nausea, vomiting, or drowsiness.  You have swelling around your face.  You have vision problems.  You have a stiff neck.  You have difficulty breathing.   This information is not intended to replace advice given to you by your health care provider. Make sure you discuss any questions you have with your health care provider.   Document Released: 12/27/2004 Document Revised: 01/17/2014 Document Reviewed: 01/11/2011 Elsevier Interactive Patient Education [employer].

## 2014-10-31 NOTE — Assessment & Plan Note (Signed)
Sinusitis. Treat with Augmentin and Atrovent nasal spray. Recheck in a few days if not better.

## 2014-10-31 NOTE — Assessment & Plan Note (Signed)
Continued pain. We discussed options including diagnostic and therapeutic SI injection. She's not interested in that at this time. We'll proceed with Flexeril and continue physical therapy. Recheck 1 month.

## 2014-10-31 NOTE — Progress Notes (Signed)
Sheryl KillingsVickie Taylor is a 44 y.o. female who [facility]  today for follow up back pain. Patient was seen about a month ago for back pain. She's had one episode of physical therapy. In the interim she's done pretty well but continues to have pain especially in her left SI joint area. She notes occasional radiating symptoms to her anterior thigh. She denies any pain radiating down below the knee. No bowel bladder dysfunction fevers chills nausea or vomiting. She would like a refill of Flexeril today if possible.   Additionally patient notes a one-week  history of runny nose cough congestion and right-sided sinus pain and pressure. Symptoms are consistent with previous episodes of bacterial sinus infections. She states that she is not interested in prednisone at this time as she has had bad experiences with that in the past.  No past medical history on file. Past Surgical History  Procedure Laterality Date  . Cesarean section      x 2   . Cholecystectomy  2003   Social History  Substance Use Topics  . Smoking status: Never Smoker   . Smokeless tobacco: Not on file  . Alcohol Use: No   family history includes Breast cancer in her maternal aunt and other family members; Heart attack in her paternal uncle; Heart failure in her father; Hyperlipidemia in her father; Lung cancer in her father.  ROS as above Medications: Current Outpatient Prescriptions  Medication Sig Dispense Refill  . cyclobenzaprine (FLEXERIL) 10 MG tablet Take 1 tablet (10 mg total) by mouth 3 (three) times daily as needed for muscle spasms. 30 tablet 0  . ibuprofen (ADVIL,MOTRIN) 800 MG tablet TAKE 1 TABLET (800 MG TOTAL) BY MOUTH EVERY 8 (EIGHT) HOURS AS NEEDED FOR PAIN. 90 tablet 1  . amoxicillin-clavulanate (AUGMENTIN) 875-125 MG tablet Take 1 tablet by mouth 2 (two) times daily. 20 tablet 0  . ipratropium (ATROVENT) 0.06 % nasal spray Place 2 sprays into both nostrils 4 (four) times  daily. 15 mL 1   No current facility-administered medications for this visit.   Allergies  Allergen Reactions  . Codeine Phosphate Itching  . Morphine And Related Swelling and Rash     Exam:  BP 117/65 mmHg  Pulse 82  Wt 143 lb (64.864 kg) Gen: Well NAD HEENT: EOMI,  MMM clear nasal discharge. Posterior pharynx with cobblestoning. Tender palpation right maxillary sinuses Lungs: Normal work of breathing. CTABL Heart: RRR no MRG Abd: NABS, Soft. Nondistended, Nontender Exts: Brisk capillary refill, warm and well perfused.  Back: Tender to palpation left SI joint. Lower extremity strength is intact. Normal gait and sensation.  No results found for this or any previous visit (from the past 24 hour(s)). No results found.   Please see individual assessment and plan sections.

## 2014-12-01 ENCOUNTER — Ambulatory Visit: Payer: BLUE CROSS/BLUE SHIELD | Admitting: Family Medicine

## 2015-01-16 ENCOUNTER — Telehealth: Payer: Self-pay

## 2015-01-16 MED ORDER — FLUCONAZOLE 150 MG PO TABS: 150.0000 mg | ORAL_TABLET | Freq: Once | ORAL | Status: DC

## 2015-01-16 NOTE — Telephone Encounter (Signed)
Pt advised of new Rx.  

## 2015-01-16 NOTE — Telephone Encounter (Signed)
Okay, I will call in a tab for yeast infection. Please encourage her to call the provider that right the antibiotics next time. If her symptoms are not resolved by Monday and she will need to make an appointment.

## 2015-01-16 NOTE — Telephone Encounter (Signed)
Sheryl BoerVicki states she has vaginal itching and believes she has a yeast infection. She has been on an antibiotic from her ENT. Denies pelvic pain, fever, chills, sweats or discharge.

## 2015-02-11 LAB — HM MAMMOGRAPHY

## 2015-02-17 ENCOUNTER — Encounter: Payer: Self-pay | Admitting: Family Medicine

## 2015-06-18 ENCOUNTER — Telehealth: Payer: Self-pay | Admitting: Family Medicine

## 2015-06-18 DIAGNOSIS — E049 Nontoxic goiter, unspecified: Secondary | ICD-10-CM

## 2015-06-18 NOTE — Telephone Encounter (Signed)
Pt called and stated that you wanted her to get another ultrasound of her thyroid after a year, so she is wanting to go ahead and get that done if possible because it was a year on 06/12/15. Thanks

## 2015-06-23 NOTE — Addendum Note (Signed)
Addended by: Deno EtienneBARKLEY, Sebastian Dzik L on: 06/23/2015 05:20 PM   Modules accepted: Orders

## 2015-06-23 NOTE — Telephone Encounter (Signed)
Order placed. Please inform pt that this has been done.Sheryl Taylor, Arah Aro FlushingLynetta

## 2015-06-25 ENCOUNTER — Other Ambulatory Visit: Payer: BLUE CROSS/BLUE SHIELD

## 2015-07-01 ENCOUNTER — Ambulatory Visit (INDEPENDENT_AMBULATORY_CARE_PROVIDER_SITE_OTHER): Payer: BLUE CROSS/BLUE SHIELD

## 2015-07-01 ENCOUNTER — Other Ambulatory Visit: Payer: BLUE CROSS/BLUE SHIELD

## 2015-07-01 DIAGNOSIS — E049 Nontoxic goiter, unspecified: Secondary | ICD-10-CM

## 2015-07-01 DIAGNOSIS — E041 Nontoxic single thyroid nodule: Secondary | ICD-10-CM | POA: Diagnosis not present

## 2015-07-06 DIAGNOSIS — J301 Allergic rhinitis due to pollen: Secondary | ICD-10-CM | POA: Diagnosis not present

## 2015-07-06 DIAGNOSIS — H9312 Tinnitus, left ear: Secondary | ICD-10-CM | POA: Diagnosis not present

## 2015-07-06 DIAGNOSIS — H6983 Other specified disorders of Eustachian tube, bilateral: Secondary | ICD-10-CM | POA: Diagnosis not present

## 2015-07-16 ENCOUNTER — Encounter: Payer: Self-pay | Admitting: Family Medicine

## 2015-07-16 ENCOUNTER — Ambulatory Visit (INDEPENDENT_AMBULATORY_CARE_PROVIDER_SITE_OTHER): Payer: BLUE CROSS/BLUE SHIELD | Admitting: Family Medicine

## 2015-07-16 ENCOUNTER — Telehealth: Payer: Self-pay | Admitting: Family Medicine

## 2015-07-16 VITALS — BP 104/51 | HR 65 | Wt 159.0 lb

## 2015-07-16 DIAGNOSIS — M7062 Trochanteric bursitis, left hip: Secondary | ICD-10-CM | POA: Diagnosis not present

## 2015-07-16 DIAGNOSIS — Z0000 Encounter for general adult medical examination without abnormal findings: Secondary | ICD-10-CM | POA: Diagnosis not present

## 2015-07-16 DIAGNOSIS — M65851 Other synovitis and tenosynovitis, right thigh: Secondary | ICD-10-CM | POA: Diagnosis not present

## 2015-07-16 DIAGNOSIS — R748 Abnormal levels of other serum enzymes: Secondary | ICD-10-CM | POA: Diagnosis not present

## 2015-07-16 DIAGNOSIS — M76891 Other specified enthesopathies of right lower limb, excluding foot: Secondary | ICD-10-CM

## 2015-07-16 DIAGNOSIS — R7989 Other specified abnormal findings of blood chemistry: Secondary | ICD-10-CM

## 2015-07-16 LAB — CBC WITH DIFFERENTIAL/PLATELET
BASOS ABS: 60 {cells}/uL (ref 0–200)
Basophils Relative: 1 %
EOS ABS: 120 {cells}/uL (ref 15–500)
EOS PCT: 2 %
HEMATOCRIT: 40.5 % (ref 35.0–45.0)
HEMOGLOBIN: 13.3 g/dL (ref 11.7–15.5)
LYMPHS ABS: 1800 {cells}/uL (ref 850–3900)
Lymphocytes Relative: 30 %
MCH: 29.2 pg (ref 27.0–33.0)
MCHC: 32.8 g/dL (ref 32.0–36.0)
MCV: 88.8 fL (ref 80.0–100.0)
MONO ABS: 540 {cells}/uL (ref 200–950)
MPV: 10.6 fL (ref 7.5–12.5)
Monocytes Relative: 9 %
NEUTROS ABS: 3480 {cells}/uL (ref 1500–7800)
NEUTROS PCT: 58 %
PLATELETS: 273 10*3/uL (ref 140–400)
RBC: 4.56 MIL/uL (ref 3.80–5.10)
RDW: 13.3 % (ref 11.0–15.0)
WBC: 6 10*3/uL (ref 3.8–10.8)

## 2015-07-16 MED ORDER — CYCLOBENZAPRINE HCL 10 MG PO TABS: 10.0000 mg | ORAL_TABLET | Freq: Two times a day (BID) | ORAL | Status: DC | PRN

## 2015-07-16 NOTE — Telephone Encounter (Signed)
Please call patient and let her know that we will mail her a copy of the hip bursitis exercises. I forgot to give them to her before she left but we will just mail them.

## 2015-07-16 NOTE — Patient Instructions (Signed)
Keep up a regular exercise program and make sure you are eating a healthy diet Try to eat 4 servings of dairy a day, or if you are lactose intolerant take a calcium with vitamin D daily.  Your vaccines are up to date.   

## 2015-07-16 NOTE — Telephone Encounter (Signed)
Pt.notified

## 2015-07-16 NOTE — Progress Notes (Signed)
Subjective:     Sheryl Taylor is a 45 y.o. female and is here for a comprehensive physical exam. The patient reports problems - left hip/leg pain.  she complains of persistent left leg pain. She feels like it originates that her hip. She was having this problem previously and actually saw Dr. Denyse Amassorey. At that time should she felt like it was coming more from her back instead of her hip. She typically sleeps on the opposite hip but when she wakes up in the morning her left hip is extremely painful. She has to move it very slowly. Things like ibuprofen do seem to help but she really tries hard not to take a lot of medication. She is requesting a refill on her muscle relaxer. She also has some pain in the right groin crease over the hip flexor. She does remember any specific injury but she has been working out very heavily. She's been going to the gym and walking at least 5 times per week.  Social History   Social History  . Marital Status: Married    Spouse Name: Karleen HampshireSpencer   . Number of Children: 2  . Years of Education: N/A   Occupational History  . teacher assistant     [employer]   Social History Main Topics  . Smoking status: Never Smoker   . Smokeless tobacco: Not on file  . Alcohol Use: No  . Drug Use: No  . Sexual Activity:    Partners: Male   Other Topics Concern  . Not on file   Social History Narrative   Patient works as a Research officer, trade unionTeachers Assistant with [employer] she is married to Pretty BayouSpencer. And has two children. She exercises 2 days a wk for an 1 hour.  Her sister died suddenty at 136 unknown cause and this makes her very anxious.     Health Maintenance  Topic Date Due  . TETANUS/TDAP  07/17/1991  . INFLUENZA VACCINE  08/11/2015  . MAMMOGRAM  02/11/2016  . PAP SMEAR  03/11/2017  . HIV Screening  Completed    The following portions of the patient's history were reviewed and updated as appropriate: allergies, current medications, past family history, past medical history, past social history,  past surgical history and problem list.  Review of Systems A comprehensive review of systems was negative.   Objective:    BP 104/51 mmHg  Pulse 65  Wt 159 lb (72.122 kg)  SpO2 99%  LMP 06/07/2015 (Approximate) General appearance: alert, cooperative and appears stated age Head: Normocephalic, without obvious abnormality, atraumatic Eyes: conj clear, EOMi, PEERLA Ears: normal TM's and external ear canals both ears Nose: Nares normal. Septum midline. Mucosa normal. No drainage or sinus tenderness. Throat: lips, mucosa, and tongue normal; teeth and gums normal Neck: no adenopathy, no carotid bruit, no JVD, supple, symmetrical, trachea midline and thyroid not enlarged, symmetric, no tenderness/mass/nodules Back: symmetric, no curvature. ROM normal. No CVA tenderness. Lungs: clear to auscultation bilaterally Breasts: normal appearance, no masses or tenderness Heart: regular rate and rhythm, S1, S2 normal, no murmur, click, rub or gallop Abdomen: soft, non-tender; bowel sounds normal; no masses,  no organomegaly Extremities: extremities normal, atraumatic, no cyanosis or edema. Hips with NROM. Tender over the left greater trochanter.   Pulses: 2+ and symmetric Skin: Skin color, texture, turgor normal. No rashes or lesions Lymph nodes: Cervical, supraclavicular, and axillary nodes normal. Neurologic: Alert and oriented X 3, normal strength and tone. Normal symmetric reflexes. Normal coordination and gait    Assessment:  Healthy female exam.      Plan:     See After Visit Summary for Counseling Recommendations  Keep up a regular exercise program and make sure you are eating a healthy diet Try to eat 4 servings of dairy a day, or if you are lactose intolerant take a calcium with vitamin D daily.  Your vaccines are up to date.   Left hip bursitis - Recommend treatment with conservative therapy. Recommend a trial of NSAIDs such as Aleve twice a day for about 10 days. Make sure take  with food and water and stop immediately if any GI upset or irritation.Can take 2 at night since her pain is actually waking her up and 1 in the morning. Given handout for exercises for bursitis.  Hip flexor strain, right-will mail her handout was additional stretches to do on her own. I still think anti-inflammatory for about 10 days would be extremely helpful.

## 2015-07-17 LAB — TSH: TSH: 3.51 mIU/L

## 2015-07-17 NOTE — Addendum Note (Signed)
Addended by: Deno EtienneBARKLEY, Emmett Bracknell L on: 07/17/2015 08:32 AM   Modules accepted: Orders, SmartSet

## 2015-07-20 ENCOUNTER — Telehealth: Payer: Self-pay

## 2015-07-20 LAB — COMPLETE METABOLIC PANEL WITH GFR
ALBUMIN: 3.9 g/dL (ref 3.6–5.1)
ALK PHOS: 66 U/L (ref 33–115)
ALT: 11 U/L (ref 6–29)
AST: 14 U/L (ref 10–30)
BUN: 12 mg/dL (ref 7–25)
CALCIUM: 8.8 mg/dL (ref 8.6–10.2)
CHLORIDE: 106 mmol/L (ref 98–110)
CO2: 20 mmol/L (ref 20–31)
Creat: 0.97 mg/dL (ref 0.50–1.10)
GFR, EST AFRICAN AMERICAN: 83 mL/min (ref >=60)
GFR, EST NON AFRICAN AMERICAN: 72 mL/min (ref >=60)
Glucose, Bld: 97 mg/dL (ref 65–99)
POTASSIUM: 4.3 mmol/L (ref 3.5–5.3)
Sodium: 142 mmol/L (ref 135–146)
Total Bilirubin: 0.5 mg/dL (ref 0.2–1.2)
Total Protein: 6.5 g/dL (ref 6.1–8.1)

## 2015-07-20 LAB — LIPID PANEL
CHOL/HDL RATIO: 3.3 ratio (ref <=5.0)
CHOLESTEROL: 173 mg/dL (ref 125–200)
HDL: 53 mg/dL (ref >=46)
LDL Cholesterol: 108 mg/dL (ref <130)
TRIGLYCERIDES: 60 mg/dL (ref <150)
VLDL: 12 mg/dL (ref <30)

## 2015-07-20 LAB — IRON: IRON: 78 ug/dL (ref 40–190)

## 2015-07-20 NOTE — Telephone Encounter (Signed)
k

## 2015-07-23 ENCOUNTER — Encounter: Payer: Self-pay | Admitting: Family Medicine

## 2015-07-23 ENCOUNTER — Ambulatory Visit (INDEPENDENT_AMBULATORY_CARE_PROVIDER_SITE_OTHER): Payer: BLUE CROSS/BLUE SHIELD | Admitting: Family Medicine

## 2015-07-23 VITALS — BP 106/51 | HR 73 | Wt 157.0 lb

## 2015-07-23 DIAGNOSIS — N289 Disorder of kidney and ureter, unspecified: Secondary | ICD-10-CM

## 2015-07-23 DIAGNOSIS — R238 Other skin changes: Secondary | ICD-10-CM | POA: Diagnosis not present

## 2015-07-23 LAB — POCT UA - MICROALBUMIN

## 2015-07-23 MED ORDER — VALACYCLOVIR HCL 1 G PO TABS: 1000.0000 mg | ORAL_TABLET | Freq: Three times a day (TID) | ORAL | Status: DC

## 2015-07-23 MED ORDER — TRIAMCINOLONE ACETONIDE 0.1 % EX CREA: 1.0000 "application " | TOPICAL_CREAM | Freq: Two times a day (BID) | CUTANEOUS | Status: DC

## 2015-07-23 NOTE — Progress Notes (Signed)
Subjective:    CC: rash under her left breast.    HPI: Patient comes in today complaining of several papules under her left breast. She said she noticed it yesterday. She felt a little itchy and then looked and saw several bumps. She did fill that she was getting some bug bites over the weekend underneath her sure he did not notice any symptoms until yesterday. She said she's had a similar rash before couple years ago but was actually on the other side of her chest. She's not been putting any creams or ointments on it. No specific treatment.  Allergies, and medications have been entered into the medical record, reviewed, and corrections made.   BP 106/51 mmHg  Pulse 73  Wt 157 lb (71.215 kg)  LMP 06/07/2015 (Approximate)    Allergies  Allergen Reactions  . Codeine Phosphate Itching  . Buprenorphine Hcl Rash and Swelling  . Morphine And Related Swelling and Rash    No past medical history on file.  Past Surgical History  Procedure Laterality Date  . Cesarean section      x 2   . Cholecystectomy  2003    Social History   Social History  . Marital Status: Married    Spouse Name: Karleen HampshireSpencer   . Number of Children: 2  . Years of Education: N/A   Occupational History  . teacher assistant     [employer]   Social History Main Topics  . Smoking status: Never Smoker   . Smokeless tobacco: Not on file  . Alcohol Use: No  . Drug Use: No  . Sexual Activity:    Partners: Male   Other Topics Concern  . Not on file   Social History Narrative   Patient works as a Research officer, trade unionTeachers Assistant with [employer] she is married to [place]ar[place]pen[place]. And has two children. She exercises 2 days a wk for an 1 hour.  Her sister died suddenty at 3636 unknown cause and this makes her very anxious.      Family History  Problem Relation Age of Onset  . Lung cancer Father     lung (50)  . Heart failure Father     (41)  . Hyperlipidemia Father   . Breast cancer      great aunt  . Breast cancer      cousin  . Heart  attack Paternal Uncle     bypass surgery   . Breast cancer Maternal Aunt     Outpatient Encounter Prescriptions as of 07/23/2015  Medication Sig  . cyclobenzaprine (FLEXERIL) 10 MG tablet Take 1 tablet (10 mg total) by mouth 2 (two) times daily as needed for muscle spasms.  [name]. ibuprofen (ADVIL,MOTRIN) 800 MG tablet TAKE 1 TABLET (800 MG TOTAL) BY MOUTH EVERY 8 (EIGHT) HOURS AS NEEDED FOR PAIN.  [name]. triamcinolone cream (KENALOG) 0.1 % Apply 1 application topically 2 (two) times daily.  . valACYclovir (VALTREX) 1000 MG tablet Take 1 tablet (1,000 mg total) by mouth 3 (three) times daily. X 7 days   No facility-administered encounter medications on file as of 07/23/2015.          Objective:    General: Well Developed, well nourished, and in no acute distress.  Neuro: Alert and oriented x3, extra-ocular muscles intact, sensation grossly intact.  HEENT: Normocephalic, atraumatic  Skin: Warm and dry.  Under the left breast she has approx 8 Erythematous papules and vesicles. No open wounds or drainage. I did lancets to the vesicles and there was very  minimal amount of fluid in either lesion. Viral culture swab obtained.    Impression and Recommendations:   Rash-unclear etiology. Shingles versus herpes simplex versus insect bites. We'll go ahead and treat with oral valacyclovir and topical triamcinolone cream for itching and redness. Viral swab obtained. Will call with results once available.  Abnormal renal function-on the initial blood work that we did the creatinine came back elevated. Evidently it was a lab error but I did recommend going ahead and checking for any proteinuria. Urine micral albumin negative today.

## 2015-07-28 ENCOUNTER — Telehealth: Payer: Self-pay | Admitting: *Deleted

## 2015-07-28 NOTE — Telephone Encounter (Signed)
Pt called and wanted to know the results of her urine culture. Will fwd to pcp.Loralee PacasBarkley, Mylz Yuan [place]

## 2015-07-28 NOTE — Telephone Encounter (Signed)
We did not do a urine culture but we did do a viral culture but it is still pending.

## 2015-07-29 ENCOUNTER — Encounter: Payer: Self-pay | Admitting: Family Medicine

## 2015-07-29 LAB — HERPES SIMPLEX VIRUS CULTURE: Organism ID, Bacteria: NOT DETECTED

## 2015-07-29 NOTE — Telephone Encounter (Signed)
Patient advised.

## 2015-08-06 LAB — RFX HSV/VARICELLA ZOSTER RAPID CULT

## 2015-08-09 LAB — CYTOMEGALOVIRUS CULTURE

## 2015-08-09 LAB — VIRAL CULTURE VIRC

## 2015-08-09 LAB — RAPID VIRAL RESP CULT SCRN W/ RFLX

## 2015-12-18 DIAGNOSIS — H903 Sensorineural hearing loss, bilateral: Secondary | ICD-10-CM | POA: Diagnosis not present

## 2015-12-18 DIAGNOSIS — H6983 Other specified disorders of Eustachian tube, bilateral: Secondary | ICD-10-CM | POA: Diagnosis not present

## 2016-01-28 ENCOUNTER — Other Ambulatory Visit: Payer: Self-pay | Admitting: Family Medicine

## 2016-01-29 ENCOUNTER — Other Ambulatory Visit: Payer: Self-pay

## 2016-01-29 MED ORDER — IBUPROFEN 800 MG PO TABS: 800.0000 mg | ORAL_TABLET | Freq: Three times a day (TID) | ORAL | 1 refills | Status: DC | PRN

## 2016-02-24 DIAGNOSIS — Z1231 Encounter for screening mammogram for malignant neoplasm of breast: Secondary | ICD-10-CM | POA: Diagnosis not present

## 2016-02-24 LAB — HM MAMMOGRAPHY

## 2016-02-25 ENCOUNTER — Encounter: Payer: Self-pay | Admitting: Family Medicine

## 2016-03-23 DIAGNOSIS — Z01419 Encounter for gynecological examination (general) (routine) without abnormal findings: Secondary | ICD-10-CM | POA: Diagnosis not present

## 2016-03-23 DIAGNOSIS — Z6829 Body mass index (BMI) 29.0-29.9, adult: Secondary | ICD-10-CM | POA: Diagnosis not present

## 2016-03-23 DIAGNOSIS — R7309 Other abnormal glucose: Secondary | ICD-10-CM | POA: Diagnosis not present

## 2016-03-23 DIAGNOSIS — Z0000 Encounter for general adult medical examination without abnormal findings: Secondary | ICD-10-CM | POA: Diagnosis not present

## 2016-03-30 ENCOUNTER — Telehealth: Payer: Self-pay | Admitting: *Deleted

## 2016-04-11 NOTE — Telephone Encounter (Signed)
Pt wanted to know if she should be concerned about her thyroid. She stated that her GYN checked this and wanted to know if she should have another US done. I informed her that this was not necessary and that if she wanted Dr. Linford Arnold she can. She voiced understanding.Loralee Pacas [place]

## 2016-05-16 DIAGNOSIS — E039 Hypothyroidism, unspecified: Secondary | ICD-10-CM | POA: Diagnosis not present

## 2016-05-20 ENCOUNTER — Encounter: Payer: Self-pay | Admitting: Family Medicine

## 2016-05-20 ENCOUNTER — Ambulatory Visit (INDEPENDENT_AMBULATORY_CARE_PROVIDER_SITE_OTHER): Payer: BLUE CROSS/BLUE SHIELD

## 2016-05-20 ENCOUNTER — Ambulatory Visit (INDEPENDENT_AMBULATORY_CARE_PROVIDER_SITE_OTHER): Payer: BLUE CROSS/BLUE SHIELD | Admitting: Family Medicine

## 2016-05-20 VITALS — BP 104/62 | HR 74 | Ht 62.0 in | Wt 160.0 lb

## 2016-05-20 DIAGNOSIS — J301 Allergic rhinitis due to pollen: Secondary | ICD-10-CM

## 2016-05-20 DIAGNOSIS — M79671 Pain in right foot: Secondary | ICD-10-CM

## 2016-05-20 DIAGNOSIS — H938X3 Other specified disorders of ear, bilateral: Secondary | ICD-10-CM

## 2016-05-20 DIAGNOSIS — M7731 Calcaneal spur, right foot: Secondary | ICD-10-CM | POA: Diagnosis not present

## 2016-05-20 MED ORDER — BECLOMETHASONE DIPROPIONATE 80 MCG/ACT NA AERS: 1.0000 | INHALATION_SPRAY | Freq: Every day | NASAL | 5 refills | Status: DC

## 2016-05-20 NOTE — Progress Notes (Signed)
Subjective:    Patient ID: Sheryl Taylor, female    DOB: 1970-05-30, 46 y.o.   MRN: [id number]  HPI She comes in today complaining of right heel pain for approximately 3 months. It seems to be worse at night. She's been trying to elevate it and use Motrin for pain relief. She rates her pain a 10 out of 10. A few days ago she actually noticed a bump on her heel as well. Says this actually feels different than when she had plantar fasciitis a couple years ago in her left foot. This is more of a burning sensation. But it is more uncomfortable when she dorsiflexes the foot. She is wearing hard flat sandals today.    She also complains of ear popping. It's been going on for about a year and a half at this point. She says sometimes it even wakes her up in the middle the night. She's been trying to use Flonase but it doesn't seem to be helping. She's tried Mucinex and even nasal saline rinses recommended by her earand throat specialist but still not helping. She wonders if she could try Qnasl.   Review of Systems BP 104/62   Pulse 74   Ht 5\' 2"  (1.575 m)   Wt 160 lb (72.6 kg)   LMP 05/20/2016 (Exact Date)   SpO2 99%   BMI 29.26 kg/m     Allergies  Allergen Reactions  . Codeine Phosphate Itching  . Buprenorphine Hcl Rash and Swelling  . Morphine And Related Swelling and Rash    No past medical history on file.  Past Surgical History:  Procedure Laterality Date  . CESAREAN SECTION     x 2   . CHOLECYSTECTOMY  2003    Social History   Social History  . Marital status: Married    Spouse name: Karleen Hampshire   . Number of children: 2  . Years of education: N/A   Occupational History  . teacher assistant     [employer]   Social History Main Topics  . Smoking status: Never Smoker  . Smokeless tobacco: Never Used  . Alcohol use No  . Drug use: No  . Sexual activity: Yes    Partners: Male   Other Topics Concern  . Not on file   Social History Narrative   Patient works as a Development worker, international aid with [employer] she is married to [place]. And has two children. She exercises 2 days a wk for an 1 hour.  Her sister died suddenty at 23 unknown cause and this makes her very anxious.      Family History  Problem Relation Age of Onset  . Lung cancer Father        lung (50)  . Heart failure Father        (41)  . Hyperlipidemia Father   . Breast cancer Unknown        great aunt  . Breast cancer Unknown        cousin  . Heart attack Paternal Uncle        bypass surgery   . Breast cancer Maternal Aunt     Outpatient Encounter Prescriptions as of 05/20/2016  Medication Sig  . ibuprofen (ADVIL,MOTRIN) 800 MG tablet Take 1 tablet (800 mg total) by mouth every 8 (eight) hours as needed.  . Beclomethasone Dipropionate (QNASL) 80 MCG/ACT AERS Place 1 spray into the nose daily.  . [DISCONTINUED] cyclobenzaprine (FLEXERIL) 10 MG tablet Take 1 tablet (10 mg total) by mouth 2 (  two) times daily as needed for muscle spasms.  . [DISCONTINUED] triamcinolone cream (KENALOG) 0.1 % Apply 1 application topically 2 (two) times daily.  . [DISCONTINUED] valACYclovir (VALTREX) 1000 MG tablet Take 1 tablet (1,000 mg total) by mouth 3 (three) times daily. X 7 days   No facility-administered encounter medications on file as of 05/20/2016.          Objective:   Physical Exam  Constitutional: She is oriented to person, place, and time. She appears well-developed and well-nourished.  HENT:  Head: Normocephalic and atraumatic.  Eyes: Conjunctivae and EOM are normal.  Cardiovascular: Normal rate.   Pulmonary/Chest: Effort normal.  Musculoskeletal:  Tender over the base of the left heel but just a little bit medially. Nontender over the ankle itself. Nontender over the Achilles. Nontender over the fat pad of the heel. She is tender over the medial heel area.  Nontender over the arch even with dorsiflexion. Strength at the ankle and toes is 5 out of 5.  Neurological: She is alert and oriented to person,  place, and time.  Skin: Skin is dry. No pallor.  Psychiatric: She has a normal mood and affect. Her behavior is normal.  Vitals reviewed.         Assessment & Plan:  Heel Pain-most likely plantar fasciitis that the pain is more of a burning versus an aching which is a little different. I do not see any evidence of fat pad injury or atrophy. I do think she starting to get a little corn on her heel but I think it's likely unrelated. Because she's having so much pain and infection difficult to even put pressure on her heel by the evening and go ahead and get an x-ray at this point to evaluate for fracture. Consider tarsal tunnel syndrome as well will start with more supportive shoe wear for the next 2 weeks. I want her to take her ibuprofen 800 mg 3 times a day for 5 days. Ice it elevated and rested. Digital encourage her to wear more supportive shoe wear including tennis shoes. Ice and elevate when able to. We'll write her a work note so that she has to be able to sit and rest for a few minutes every 20 minutes to be able to take pressure off of it.  Ear popping-explained that there is probably not a lot we can really do to get this better. We can try a little bit stronger nasal steroid spray just to see if she feels like it's more effective. But it may or may not provide relief she is looking for.

## 2016-07-18 ENCOUNTER — Encounter: Payer: BLUE CROSS/BLUE SHIELD | Admitting: Family Medicine

## 2016-07-21 ENCOUNTER — Telehealth: Payer: Self-pay | Admitting: Family Medicine

## 2016-07-21 DIAGNOSIS — D509 Iron deficiency anemia, unspecified: Secondary | ICD-10-CM

## 2016-07-21 DIAGNOSIS — Z13220 Encounter for screening for lipoid disorders: Secondary | ICD-10-CM

## 2016-07-21 DIAGNOSIS — Z131 Encounter for screening for diabetes mellitus: Secondary | ICD-10-CM

## 2016-07-21 NOTE — Telephone Encounter (Signed)
Patient called scheduled physical for 07/29/16 and would like to get her labs done 07/22/16 on her off day. Thanks

## 2016-07-21 NOTE — Telephone Encounter (Signed)
Labs ordered & faxed to lab.

## 2016-07-21 NOTE — Addendum Note (Signed)
Addended by: Juel BurrowBENDER, Tylyn Derwin L on: 07/21/2016 04:32 PM   Modules accepted: Orders

## 2016-07-21 NOTE — Telephone Encounter (Signed)
Patient advised.

## 2016-07-29 ENCOUNTER — Encounter: Payer: Self-pay | Admitting: Family Medicine

## 2016-07-29 ENCOUNTER — Ambulatory Visit (INDEPENDENT_AMBULATORY_CARE_PROVIDER_SITE_OTHER): Payer: BLUE CROSS/BLUE SHIELD | Admitting: Family Medicine

## 2016-07-29 ENCOUNTER — Telehealth: Payer: Self-pay | Admitting: Family Medicine

## 2016-07-29 VITALS — BP 116/66 | HR 92 | Ht 62.0 in | Wt 162.0 lb

## 2016-07-29 DIAGNOSIS — Z131 Encounter for screening for diabetes mellitus: Secondary | ICD-10-CM | POA: Diagnosis not present

## 2016-07-29 DIAGNOSIS — D509 Iron deficiency anemia, unspecified: Secondary | ICD-10-CM | POA: Diagnosis not present

## 2016-07-29 DIAGNOSIS — M722 Plantar fascial fibromatosis: Secondary | ICD-10-CM | POA: Diagnosis not present

## 2016-07-29 DIAGNOSIS — Z13220 Encounter for screening for lipoid disorders: Secondary | ICD-10-CM | POA: Diagnosis not present

## 2016-07-29 DIAGNOSIS — Z0000 Encounter for general adult medical examination without abnormal findings: Secondary | ICD-10-CM | POA: Diagnosis not present

## 2016-07-29 NOTE — Patient Instructions (Addendum)
Preventive Care 40-64 Years, Female Preventive care refers to lifestyle choices and visits with your health care provider that can promote health and wellness. What does preventive care include?  A yearly physical exam. This is also called an annual well check.  Dental exams once or twice a year.  Routine eye exams. Ask your health care provider how often you should have your eyes checked.  Personal lifestyle choices, including: ? Daily care of your teeth and gums. ? Regular physical activity. ? Eating a healthy diet. ? Avoiding tobacco and drug use. ? Limiting alcohol use. ? Practicing safe sex. ? Taking low-dose aspirin daily starting at age 58. ? Taking vitamin and mineral supplements as recommended by your health care provider. What happens during an annual well check? The services and screenings done by your health care provider during your annual well check will depend on your age, overall health, lifestyle risk factors, and family history of disease. Counseling Your health care provider may ask you questions about your:  Alcohol use.  Tobacco use.  Drug use.  Emotional well-being.  Home and relationship well-being.  Sexual activity.  Eating habits.  Work and work Statistician.  Method of birth control.  Menstrual cycle.  Pregnancy history.  Screening You may have the following tests or measurements:  Height, weight, and BMI.  Blood pressure.  Lipid and cholesterol levels. These may be checked every 5 years, or more frequently if you are over 81 years old.  Skin check.  Lung cancer screening. You may have this screening every year starting at age 78 if you have a 30-pack-year history of smoking and currently smoke or have quit within the past 15 years.  Fecal occult blood test (FOBT) of the stool. You may have this test every year starting at age 65.  Flexible sigmoidoscopy or colonoscopy. You may have a sigmoidoscopy every 5 years or a colonoscopy  every 10 years starting at age 30.  Hepatitis C blood test.  Hepatitis B blood test.  Sexually transmitted disease (STD) testing.  Diabetes screening. This is done by checking your blood sugar (glucose) after you have not eaten for a while (fasting). You may have this done every 1-3 years.  Mammogram. This may be done every 1-2 years. Talk to your health care provider about when you should start having regular mammograms. This may depend on whether you have a family history of breast cancer.  BRCA-related cancer screening. This may be done if you have a family history of breast, ovarian, tubal, or peritoneal cancers.  Pelvic exam and Pap test. This may be done every 3 years starting at age 80. Starting at age 36, this may be done every 5 years if you have a Pap test in combination with an HPV test.  Bone density scan. This is done to screen for osteoporosis. You may have this scan if you are at high risk for osteoporosis.  Discuss your test results, treatment options, and if necessary, the need for more tests with your health care provider. Vaccines Your health care provider may recommend certain vaccines, such as:  Influenza vaccine. This is recommended every year.  Tetanus, diphtheria, and acellular pertussis (Tdap, Td) vaccine. You may need a Td booster every 10 years.  Varicella vaccine. You may need this if you have not been vaccinated.  Zoster vaccine. You may need this after age 5.  Measles, mumps, and rubella (MMR) vaccine. You may need at least one dose of MMR if you were born in  1957 or later. You may also need a second dose.  Pneumococcal 13-valent conjugate (PCV13) vaccine. You may need this if you have certain conditions and were not previously vaccinated.  Pneumococcal polysaccharide (PPSV23) vaccine. You may need one or two doses if you smoke cigarettes or if you have certain conditions.  Meningococcal vaccine. You may need this if you have certain  conditions.  Hepatitis A vaccine. You may need this if you have certain conditions or if you travel or work in places where you may be exposed to hepatitis A.  Hepatitis B vaccine. You may need this if you have certain conditions or if you travel or work in places where you may be exposed to hepatitis B.  Haemophilus influenzae type b (Hib) vaccine. You may need this if you have certain conditions.  Talk to your health care provider about which screenings and vaccines you need and how often you need them. This information is not intended to replace advice given to you by your health care provider. Make sure you discuss any questions you have with your health care provider. Document Released: 01/23/2015 Document Revised: 09/16/2015 Document Reviewed: 10/28/2014 Elsevier Interactive Patient Education  2017 [employer].

## 2016-07-29 NOTE — Telephone Encounter (Signed)
Labs added.

## 2016-07-29 NOTE — Progress Notes (Signed)
   Subjective:    I'm seeing this patient as a consultation for:  Dr. Nani Gasseratherine Metheney  CC: Right heel pain  HPI: For sometime now this pleasant 46 year old female has had severe pain that she localizes on the plantar aspect of the right heel, worse with the first few steps in the morning, has tried a boot, activity modification, unfortunately has had persistent pain.  Past medical history, Surgical history, Family history not pertinant except as noted below, Social history, Allergies, and medications have been entered into the medical record, reviewed, and no changes needed.   Review of Systems: No headache, visual changes, nausea, vomiting, diarrhea, constipation, dizziness, abdominal pain, skin rash, fevers, chills, night sweats, weight loss, swollen lymph nodes, body aches, joint swelling, muscle aches, chest pain, shortness of breath, mood changes, visual or auditory hallucinations.   Objective:   General: Well Developed, well nourished, and in no acute distress.  Neuro:  Extra-ocular muscles intact, able to move all 4 extremities, sensation grossly intact.  Deep tendon reflexes tested were normal. Psych: Alert and oriented, mood congruent with affect. ENT:  Ears and nose appear unremarkable.  Hearing grossly normal. Neck: Unremarkable overall appearance, trachea midline.  No visible thyroid enlargement. Eyes: Conjunctivae and lids appear unremarkable.  Pupils equal and round. Skin: Warm and dry, no rashes noted.  Cardiovascular: Pulses palpable, no extremity edema. Right Foot: No visible erythema or swelling. Range of motion is full in all directions. Strength is 5/5 in all directions. No hallux valgus. No pes cavus or pes planus. No abnormal callus noted. No pain over the navicular prominence, or base of fifth metatarsal. Severe tenderness to palpation of the calcaneal insertion of plantar fascia. No pain at the Achilles insertion. No pain over the calcaneal bursa. No pain  of the retrocalcaneal bursa. No tenderness to palpation over the tarsals, metatarsals, or phalanges. No hallux rigidus or limitus. No tenderness palpation over interphalangeal joints. No pain with compression of the metatarsal heads. Neurovascularly intact distally.  Procedure: Real-time Ultrasound Guided Injection of right plantar fascia origin Device: GE Logiq E  Verbal informed consent obtained.  Time-out conducted.  Noted no overlying erythema, induration, or other signs of local infection.  Skin prepped in a sterile fashion.  Local anesthesia: Topical Ethyl chloride.  With sterile technique and under real time ultrasound guidance:  Using a 25-gauge needle advanced just deep to the calcaneal origin of the plantar fascia, I then injected 1 mL Kenalog 40, 1 mL lidocaine, 1 mL bupivacaine. Completed without difficulty  Pain immediately resolved suggesting accurate placement of the medication.  Advised to call if fevers/chills, erythema, induration, drainage, or persistent bleeding.  Images permanently stored and available for review in the ultrasound unit.  Impression: Technically successful ultrasound guided injection.  Impression and Recommendations:   This case required medical decision making of moderate complexity.  Plantar fasciitis, right Injection as above, rehabilitation exercises given, return for custom orthotics.

## 2016-07-29 NOTE — Progress Notes (Signed)
Subjective:     Sheryl Taylor is a 46 y.o. female and is here for a comprehensive physical exam. The patient reports problems - she had a few questions today.   [name].Unfortunately she has continued to have right heel pain. Most likely consistent with plantar fasciitis. She's been doing the stretches. She got better supportive shoe wear. She then burred her friends boot which sounds like a Cam Walker and says it really is just not getting any better. She has been trying to walk some for exercise to help with her cholesterol and help control her weight.. The week the pain is worse especially she's been sitting for a while and then gets up and puts pressure on the heel.  Social History   Social History  . Marital status: Married    Spouse name: Karleen HampshireSpencer   . Number of children: 2  . Years of education: N/A   Occupational History  . teacher assistant     [employer]   Social History Main Topics  . Smoking status: Never Smoker  . Smokeless tobacco: Never Used  . Alcohol use No  . Drug use: No  . Sexual activity: Yes    Partners: Male   Other Topics Concern  . 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 And has two children. She exercises 2 days a wk for an 1 hour.  Her sister died suddenty at 1236 unknown cause and this makes her very anxious.     Health Maintenance  Topic Date Due  . TETANUS/TDAP  07/15/2017 (Originally 07/17/1991)  . INFLUENZA VACCINE  08/10/2016  . MAMMOGRAM  02/23/2017  . PAP SMEAR  03/11/2017  . HIV Screening  Completed    The following portions of the patient's history were reviewed and updated as appropriate: allergies, current medications, past family history, past medical history, past social history, past surgical history and problem list.  Review of Systems A comprehensive review of systems was negative.   Objective:    BP 116/66   Pulse 92   Ht 5\' 2"  (1.575 m)   Wt 162 lb (73.5 kg)   BMI  29.63 kg/m  General appearance: alert, cooperative and appears stated age Head: Normocephalic, without obvious abnormality, atraumatic Eyes: conj clear, EOMI, PEERLA Ears: normal TM's and external ear canals both ears Nose: Nares normal. Septum midline. Mucosa normal. No drainage or sinus tenderness. Throat: lips, mucosa, and tongue normal; teeth and gums normal Neck: no adenopathy, no carotid bruit, no JVD, supple, symmetrical, trachea midline and thyroid not enlarged, symmetric, no tenderness/mass/nodules Back: symmetric, no curvature. ROM normal. No CVA tenderness. Lungs: clear to auscultation bilaterally Heart: regular rate and rhythm, S1, S2 normal, no murmur, click, rub or gallop Abdomen: soft, non-tender; bowel sounds normal; no masses,  no organomegaly Extremities: extremities normal, atraumatic, no cyanosis or edema Pulses: 2+ and symmetric Skin: Skin color, texture, turgor normal. No rashes or lesions Lymph nodes: Cervical, supraclavicular, and axillary nodes normal. Neurologic: Alert and oriented X 3, normal strength and tone. Normal symmetric reflexes. Normal coordination and gait    Assessment:    Healthy female exam.      Plan:     See After Visit Summary for Counseling Recommendations   Keep up a regular exercise program and make sure you are eating a healthy diet Try to eat 4 servings of dairy a day, or if you are lactose intolerant take a calcium with  vitamin D daily.  Your vaccines are up to date.  She did get for her lab work today's we'll call her with those results once available.   Right heel pain consistent with plantar fasciitis-recommend that she see one of our sports medicine docs for possible injection and further treatment. She might also benefit from night splints.  She did go for her blood work today but she would like to have a thyroid level added onto her labs.

## 2016-07-29 NOTE — Telephone Encounter (Signed)
Patient went for labs this morning. Please call the lab and see if they can add a TSH. Consider use wellness/physical code.

## 2016-07-29 NOTE — Assessment & Plan Note (Signed)
Injection as above, rehabilitation exercises given, return for custom orthotics.

## 2016-07-30 LAB — COMPLETE METABOLIC PANEL WITH GFR
ALBUMIN: 4 g/dL (ref 3.6–5.1)
ALK PHOS: 79 U/L (ref 33–115)
ALT: 13 U/L (ref 6–29)
AST: 15 U/L (ref 10–30)
BUN: 14 mg/dL (ref 7–25)
CO2: 24 mmol/L (ref 20–31)
Calcium: 8.6 mg/dL (ref 8.6–10.2)
Chloride: 107 mmol/L (ref 98–110)
Creat: 0.98 mg/dL (ref 0.50–1.10)
GFR, EST NON AFRICAN AMERICAN: 70 mL/min (ref >=60)
GFR, Est African American: 81 mL/min (ref >=60)
GLUCOSE: 88 mg/dL (ref 65–99)
POTASSIUM: 4.3 mmol/L (ref 3.5–5.3)
SODIUM: 141 mmol/L (ref 135–146)
Total Bilirubin: 0.4 mg/dL (ref 0.2–1.2)
Total Protein: 6.4 g/dL (ref 6.1–8.1)

## 2016-07-30 LAB — TSH: TSH: 2.82 mIU/L

## 2016-07-30 LAB — LIPID PANEL W/REFLEX DIRECT LDL
CHOL/HDL RATIO: 3.1 ratio (ref <5.0)
Cholesterol: 160 mg/dL (ref <200)
HDL: 51 mg/dL (ref >50)
LDL-Cholesterol: 95 mg/dL
Non-HDL Cholesterol (Calc): 109 mg/dL (ref <130)
TRIGLYCERIDES: 48 mg/dL (ref <150)

## 2016-07-30 LAB — IRON: Iron: 52 ug/dL (ref 40–190)

## 2016-08-10 ENCOUNTER — Telehealth: Payer: Self-pay | Admitting: Family Medicine

## 2016-08-10 NOTE — Telephone Encounter (Signed)
Patient called req to know how are inserts she has an appt with you on Friday. Thanks

## 2016-08-11 NOTE — Telephone Encounter (Signed)
Pt request to know how much do inserts cost?

## 2016-08-12 ENCOUNTER — Encounter: Payer: BLUE CROSS/BLUE SHIELD | Admitting: Sports Medicine

## 2016-08-23 ENCOUNTER — Encounter: Payer: BLUE CROSS/BLUE SHIELD | Admitting: Family Medicine

## 2016-09-15 ENCOUNTER — Ambulatory Visit (INDEPENDENT_AMBULATORY_CARE_PROVIDER_SITE_OTHER): Payer: BLUE CROSS/BLUE SHIELD | Admitting: Family Medicine

## 2016-09-15 ENCOUNTER — Encounter: Payer: Self-pay | Admitting: Family Medicine

## 2016-09-15 VITALS — BP 111/62 | HR 87 | Temp 98.5°F | Ht 62.0 in | Wt 162.0 lb

## 2016-09-15 DIAGNOSIS — J209 Acute bronchitis, unspecified: Secondary | ICD-10-CM | POA: Diagnosis not present

## 2016-09-15 MED ORDER — ALBUTEROL SULFATE HFA 108 (90 BASE) MCG/ACT IN AERS: 2.0000 | INHALATION_SPRAY | Freq: Four times a day (QID) | RESPIRATORY_TRACT | 2 refills | Status: DC | PRN

## 2016-09-15 MED ORDER — BENZONATATE 200 MG PO CAPS: 200.0000 mg | ORAL_CAPSULE | Freq: Two times a day (BID) | ORAL | 0 refills | Status: DC | PRN

## 2016-09-15 MED ORDER — HYDROCODONE-HOMATROPINE 5-1.5 MG/5ML PO SYRP: 5.0000 mL | ORAL_SOLUTION | Freq: Every evening | ORAL | 0 refills | Status: DC | PRN

## 2016-09-15 MED ORDER — ALBUTEROL SULFATE (2.5 MG/3ML) 0.083% IN NEBU
2.5000 mg | INHALATION_SOLUTION | Freq: Once | RESPIRATORY_TRACT | Status: AC
  Administered 2016-09-15: 2.5 mg via RESPIRATORY_TRACT

## 2016-09-15 NOTE — Progress Notes (Signed)
Subjective:    Patient ID: Sheryl Taylor, female    DOB: Feb 01, 1970, 46 y.o.   MRN: [id number]  HPI ST and cough x 5 days.  Says not sure if feer but felt very cold at work the other day.  She has been taking zyrtec and nyquail. Cough is dry and non productive.   She can't sleep. Sore under both ears. ST is better than it was. Says was outside at work today. 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 She did have asthma as a child.  Review of Systems  BP 111/62   Pulse 87   Temp 98.5 F (36.9 C)   Ht  (1.575 m)   Wt 162 lb (73.5 kg)   SpO2 98%   PF 160 L/min Comment: red zone  BMI 29.63 kg/m     Allergies  Allergen Reactions  . Codeine Phosphate Itching  . Buprenorphine Hcl Rash and Swelling  . Morphine And Related Swelling and Rash    No past medical history on file.  Past Surgical History:  Procedure Laterality Date  . CESAREAN SECTION     x 2   . CHOLECYSTECTOMY  2003    Social History   Social History  . Marital status: Married    Spouse name: Karleen Hampshire   . Number of children: 2  . Years of education: N/A   Occupational History  . teacher assistant     [employer]   Social History Main Topics  . Smoking status: Never Smoker  . Smokeless tobacco: Never Used  . Alcohol use No  . Drug use: No  . Sexual activity: Yes    Partners: Male   Other Topics Concern  . 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 And has two children. She exercises 2 days a wk for an 1 hour.  Her sister died suddenty at 31 unknown cause and this makes her very anxious.      Family History  Problem Relation Age of Onset  . Lung cancer Father        lung (50)  . Heart failure Father        (41)  . Hyperlipidemia Father   . Breast cancer Unknown        great aunt  . Breast cancer Unknown        cousin  . Heart attack Paternal Uncle      bypass surgery   . Breast cancer Maternal Aunt     Outpatient Encounter Prescriptions as of 09/15/2016  Medication Sig  . Beclomethasone Dipropionate (QNASL) 80 MCG/ACT AERS Place 1 spray into the nose daily.  [name] ibuprofen (ADVIL,MOTRIN) 800 MG tablet Take 1 tablet (800 mg total) by mouth every 8 (eight) hours as needed.  [name] albuterol (PROVENTIL HFA;VENTOLIN HFA) 108 (90 Base) MCG/ACT inhaler Inhale 2 puffs into the lungs every 6 (six) hours as needed for wheezing or shortness of breath.  . benzonatate (TESSALON) 200 MG capsule Take 1 capsule (200 mg total) by mouth 2 (two) times daily as needed for cough.  [name] HYDROcodone-homatropine (HYCODAN) 5-1.5 MG/5ML syrup Take 5 mLs by mouth at bedtime as needed for cough.  . [EXPIRED] albuterol (PROVENTIL) (2.5 MG/3ML) 0.083% nebulizer solution 2.5 mg    No facility-administered encounter medications on file as of 09/15/2016.  Objective:   Physical Exam  Constitutional: She is oriented to person, place, and time. She appears well-developed and well-nourished.  HENT:  Head: Normocephalic and atraumatic.  Right Ear: External ear normal.  Left Ear: External ear normal.  Nose: Nose normal.  Mouth/Throat: Oropharynx is clear and moist.  TMs and canals are clear.   Eyes: Pupils are equal, round, and reactive to light. Conjunctivae and EOM are normal.  Neck: Neck supple. No thyromegaly present.  Cardiovascular: Normal rate, regular rhythm and normal heart sounds.   Pulmonary/Chest: Effort normal. She has wheezes.  Slight mild expiratory wheeze, left upper lung  Lymphadenopathy:    She has no cervical adenopathy.  Neurological: She is alert and oriented to person, place, and time.  Skin: Skin is warm and dry.  Psychiatric: She has a normal mood and affect.        Assessment & Plan:  Acute bronchitis with bronchospasm - will perfom peak flow. Peak flow in the red zone.  Albuterol neb given.  Repeat Peak flow in the 226 in the yellow  zone. Will call in albuterol inhaler. Given cough med.  She is okay with trying the hydrocodone cough syrup. She has had reactions to codeine before so I did warn her that if she starts to experience any symptoms or side effects to stop it immediately and take 2 Benadryl. Also given a perception for [employer] Perles. If not improving then consider a course of prednisone as well. She didn't want to try that at this point in the illness.

## 2016-09-22 ENCOUNTER — Telehealth: Payer: Self-pay | Admitting: Family Medicine

## 2016-09-22 MED ORDER — AZITHROMYCIN 250 MG PO TABS: ORAL_TABLET | ORAL | 0 refills | Status: AC

## 2016-09-22 NOTE — Telephone Encounter (Signed)
Please call patient: She was still having persistent cough so please let her know that did send in a prescription to [facility] [address] for Z-Pak. If she's not feeling much better afterwards and please call me back.

## 2016-09-22 NOTE — Telephone Encounter (Signed)
Called pt and lvm informing her that medication was sent to her pharmacy and to call back to let us know how she is doing.Heath GoldBarkley, Jammy Plotkin [name] '

## 2017-01-20 ENCOUNTER — Ambulatory Visit (INDEPENDENT_AMBULATORY_CARE_PROVIDER_SITE_OTHER): Payer: BLUE CROSS/BLUE SHIELD | Admitting: Family Medicine

## 2017-01-20 ENCOUNTER — Encounter: Payer: Self-pay | Admitting: Family Medicine

## 2017-01-20 VITALS — BP 114/57 | HR 81 | Ht 62.0 in | Wt 164.0 lb

## 2017-01-20 DIAGNOSIS — J40 Bronchitis, not specified as acute or chronic: Secondary | ICD-10-CM | POA: Diagnosis not present

## 2017-01-20 DIAGNOSIS — J329 Chronic sinusitis, unspecified: Secondary | ICD-10-CM | POA: Diagnosis not present

## 2017-01-20 MED ORDER — AZITHROMYCIN 250 MG PO TABS: ORAL_TABLET | ORAL | 0 refills | Status: AC

## 2017-01-20 MED ORDER — HYDROCODONE-HOMATROPINE 5-1.5 MG/5ML PO SYRP: 5.0000 mL | ORAL_SOLUTION | Freq: Every evening | ORAL | 0 refills | Status: DC | PRN

## 2017-01-20 NOTE — Patient Instructions (Signed)
Make sure to use your inhaler 2-4 puffs every 6 hours as needed.  Especially if your chest feels tight or if your hearing wheezing.

## 2017-01-20 NOTE — Progress Notes (Signed)
   Subjective:    Patient ID: Sheryl Taylor, female    DOB: 02/23/1970, 47 y.o.   MRN: [id number]  HPI 47 year old female comes in today complaining of cough and nasal congestion for about 6-1/2 days.  It started last Saturday.  No fever but she has had chills.  She has not had a sore throat or ear pain.  She feels like the cough is mostly dry but says it feels very forceful.  She is been taking some over-the-counter DayQuil, NyQuil.    Review of Systems     Objective:   Physical Exam  Constitutional: She is oriented to person, place, and time. She appears well-developed and well-nourished.  HENT:  Head: Normocephalic and atraumatic.  Right Ear: External ear normal.  Left Ear: External ear normal.  Nose: Nose normal.  Mouth/Throat: Oropharynx is clear and moist.  TMs and canals are clear.   Eyes: Conjunctivae and EOM are normal. Pupils are equal, round, and reactive to light.  Neck: Neck supple. No thyromegaly present.  Cardiovascular: Normal rate, regular rhythm and normal heart sounds.  Pulmonary/Chest: Effort normal. She has wheezes.  Slight expiratory wheezing.  No rhonchi or crackles.  Lymphadenopathy:    She has no cervical adenopathy.  Neurological: She is alert and oriented to person, place, and time.  Skin: Skin is warm and dry.  Psychiatric: She has a normal mood and affect.          Assessment & Plan:  Sinobronchitis -discussed with patient that at this point I think we should treat her with an antibiotic since she is not improving and has had persistent symptoms for a week.  Call if not improving.  Encouraged her to use her albuterol inhaler when she she can do 2-4 puffs every 6 hours as needed.  You just some slight expiratory wheezing on exam today.

## 2017-02-06 ENCOUNTER — Ambulatory Visit (INDEPENDENT_AMBULATORY_CARE_PROVIDER_SITE_OTHER): Payer: BLUE CROSS/BLUE SHIELD | Admitting: Sports Medicine

## 2017-02-06 ENCOUNTER — Encounter: Payer: Self-pay | Admitting: Sports Medicine

## 2017-02-06 ENCOUNTER — Ambulatory Visit (INDEPENDENT_AMBULATORY_CARE_PROVIDER_SITE_OTHER): Payer: BLUE CROSS/BLUE SHIELD

## 2017-02-06 DIAGNOSIS — M48061 Spinal stenosis, lumbar region without neurogenic claudication: Secondary | ICD-10-CM | POA: Diagnosis not present

## 2017-02-06 DIAGNOSIS — M5136 Other intervertebral disc degeneration, lumbar region: Secondary | ICD-10-CM

## 2017-02-06 DIAGNOSIS — M545 Low back pain: Secondary | ICD-10-CM | POA: Diagnosis not present

## 2017-02-06 DIAGNOSIS — M51369 Other intervertebral disc degeneration, lumbar region without mention of lumbar back pain or lower extremity pain: Secondary | ICD-10-CM

## 2017-02-06 MED ORDER — MELOXICAM 15 MG PO TABS: ORAL_TABLET | ORAL | 3 refills | Status: DC

## 2017-02-06 MED ORDER — PREDNISONE 50 MG PO TABS
ORAL_TABLET | ORAL | 0 refills | Status: DC
Start: 2017-02-06 — End: 2017-03-17

## 2017-02-06 MED ORDER — CYCLOBENZAPRINE HCL 10 MG PO TABS
ORAL_TABLET | ORAL | 0 refills | Status: DC
Start: 2017-02-06 — End: 2017-03-17

## 2017-02-06 NOTE — Assessment & Plan Note (Signed)
Currently in a flare, prednisone, Flexeril, meloxicam. Repeat x-rays. Rehab exercises given, return in 1 month.

## 2017-02-06 NOTE — Progress Notes (Signed)
Subjective:    I'm seeing this patient as a consultation for: Dr. Nani Gasseratherine Metheney  CC: Acute low back pain  HPI: This is a pleasant 47 year old female, which she bent over to pick up an object, felt a pop and immediate pain in the left side of her low back, radiation to the anterior left thigh, severe, persistent, no bowel or bladder dysfunction, saddle numbness, constitutional symptoms.  I reviewed the past medical history, family history, social history, surgical history, and allergies today and no changes were needed.  Please see the problem list section below in epic for further details.  Past Medical History: No past medical history on file. Past Surgical History: Past Surgical History:  Procedure Laterality Date  . CESAREAN SECTION     x 2   . CHOLECYSTECTOMY  2003   Social History: Social History   Socioeconomic History  . Marital status: Married    Spouse name: Karleen HampshireSpencer   . Number of children: 2  . Years of education: None  . Highest education level: None  Social Needs  . Financial resource strain: None  . Food insecurity - worry: None  . Food insecurity - inability: None  . Transportation needs - medical: None  . Transportation needs - non-medical: None  Occupational History  . Occupation: Geologist, engineeringteacher assistant    Comment: [employer]  Tobacco Use  . Smoking status: Never Smoker  . Smokeless tobacco: Never Used  Substance and Sexual Activity  . Alcohol use: No    Alcohol/week: 0.0 oz  . Drug use: No  . Sexual activity: Yes    Partners: Male  Other Topics Concern  . None  Social History Narrative   Patient works as a Research officer, trade unionTeachers Assistant with [employer] she is married to [place]. And has two children. She exercises 2 days a wk for an 1 hour.  Her sister died suddenty at 7036 unknown cause and this makes her very anxious.     Family History: Family History  Problem Relation Age of Onset  . Lung cancer Father        lung (50)  . Heart failure Father        (41)  .  Hyperlipidemia Father   . Breast cancer Unknown        great aunt  . Breast cancer Unknown        cousin  . Heart attack Paternal Uncle        bypass surgery   . Breast cancer Maternal Aunt    Allergies: Allergies  Allergen Reactions  . Codeine Phosphate Itching  . Buprenorphine Hcl Rash and Swelling  . Morphine And Related Swelling and Rash   Medications: See med rec.  Review of Systems: No headache, visual changes, nausea, vomiting, diarrhea, constipation, dizziness, abdominal pain, skin rash, fevers, chills, night sweats, weight loss, swollen lymph nodes, body aches, joint swelling, muscle aches, chest pain, shortness of breath, mood changes, visual or auditory hallucinations.   Objective:   General: Well Developed, well nourished, and in no acute distress.  Neuro:  Extra-ocular muscles intact, able to move all 4 extremities, sensation grossly intact.  Deep tendon reflexes tested were normal. Psych: Alert and oriented, mood congruent with affect. ENT:  Ears and nose appear unremarkable.  Hearing grossly normal. Neck: Unremarkable overall appearance, trachea midline.  No visible thyroid enlargement. Eyes: Conjunctivae and lids appear unremarkable.  Pupils equal and round. Skin: Warm and dry, no rashes noted.  Cardiovascular: Pulses palpable, no extremity edema. Back Exam:  Inspection:  Unremarkable  Motion: Flexion 45 deg, Extension 45 deg, Side Bending to 45 deg bilaterally,  Rotation to 45 deg bilaterally, pain is limited range of motion is limited by pain SLR laying: Negative  XSLR laying: Negative  Palpable tenderness: None. FABER: negative. Sensory change: Gross sensation intact to all lumbar and sacral dermatomes.  Reflexes: 2+ at both patellar tendons, 2+ at achilles tendons, Babinski's downgoing.  Strength at foot  Plantar-flexion: 5/5 Dorsi-flexion: 5/5 Eversion: 5/5 Inversion: 5/5  Leg strength  Quad: 5/5 Hamstring: 5/5 Hip flexor: 5/5 Hip abductors: 5/5  Gait  unremarkable.  Impression and Recommendations:   This case required medical decision making of moderate complexity.  Lumbar degenerative disc disease Currently in a flare, prednisone, Flexeril, meloxicam. Repeat x-rays. Rehab exercises given, return in 1 month.  ___________________________________________ Ihor Austin. Benjamin Stain, M.D., ABFM., CAQSM. Primary Care and Sports Medicine [facility]  Adjunct Instructor of Family Medicine  [employer]

## 2017-02-23 ENCOUNTER — Encounter: Payer: Self-pay | Admitting: Family Medicine

## 2017-03-03 DIAGNOSIS — Z1231 Encounter for screening mammogram for malignant neoplasm of breast: Secondary | ICD-10-CM | POA: Diagnosis not present

## 2017-03-03 LAB — HM MAMMOGRAPHY

## 2017-03-07 LAB — HM MAMMOGRAPHY

## 2017-03-09 ENCOUNTER — Ambulatory Visit: Payer: BLUE CROSS/BLUE SHIELD | Admitting: Sports Medicine

## 2017-03-13 DIAGNOSIS — R0789 Other chest pain: Secondary | ICD-10-CM | POA: Diagnosis not present

## 2017-03-13 DIAGNOSIS — M542 Cervicalgia: Secondary | ICD-10-CM | POA: Diagnosis not present

## 2017-03-13 DIAGNOSIS — R079 Chest pain, unspecified: Secondary | ICD-10-CM | POA: Diagnosis not present

## 2017-03-13 DIAGNOSIS — R51 Headache: Secondary | ICD-10-CM | POA: Diagnosis not present

## 2017-03-13 DIAGNOSIS — Z885 Allergy status to narcotic agent status: Secondary | ICD-10-CM | POA: Diagnosis not present

## 2017-03-13 DIAGNOSIS — Z79899 Other long term (current) drug therapy: Secondary | ICD-10-CM | POA: Diagnosis not present

## 2017-03-13 DIAGNOSIS — Z791 Long term (current) use of non-steroidal anti-inflammatories (NSAID): Secondary | ICD-10-CM | POA: Diagnosis not present

## 2017-03-14 ENCOUNTER — Telehealth: Payer: Self-pay | Admitting: Family Medicine

## 2017-03-14 NOTE — Telephone Encounter (Signed)
If patient has already been worked up in the ED for emergent chest pain issues then she just needs an appointment.

## 2017-03-14 NOTE — Telephone Encounter (Signed)
Pt called and states that she had been having chest pains so [facility] and they state that it was to do with a muscle but if it did not go away to contact her pcp and see what she advises. Pt still has the discomfort, so please advise

## 2017-03-17 ENCOUNTER — Encounter: Payer: Self-pay | Admitting: Family Medicine

## 2017-03-17 ENCOUNTER — Ambulatory Visit (INDEPENDENT_AMBULATORY_CARE_PROVIDER_SITE_OTHER): Payer: BLUE CROSS/BLUE SHIELD | Admitting: Family Medicine

## 2017-03-17 VITALS — BP 106/56 | HR 76 | Ht 62.0 in | Wt 157.0 lb

## 2017-03-17 DIAGNOSIS — R0602 Shortness of breath: Secondary | ICD-10-CM

## 2017-03-17 DIAGNOSIS — J0100 Acute maxillary sinusitis, unspecified: Secondary | ICD-10-CM | POA: Diagnosis not present

## 2017-03-17 DIAGNOSIS — M79602 Pain in left arm: Secondary | ICD-10-CM

## 2017-03-17 DIAGNOSIS — R079 Chest pain, unspecified: Secondary | ICD-10-CM

## 2017-03-17 DIAGNOSIS — R0789 Other chest pain: Secondary | ICD-10-CM | POA: Diagnosis not present

## 2017-03-17 LAB — TROPONIN I: Troponin I: 0.01 ng/mL (ref <=0.0)

## 2017-03-17 LAB — D-DIMER, QUANTITATIVE: D-Dimer, Quant: 0.35 mcg/mL FEU (ref <0.50)

## 2017-03-17 MED ORDER — AMOXICILLIN-POT CLAVULANATE 875-125 MG PO TABS: 1.0000 | ORAL_TABLET | Freq: Two times a day (BID) | ORAL | 0 refills | Status: DC

## 2017-03-17 NOTE — Progress Notes (Signed)
Subjective:    Patient ID: Sheryl Taylor, female    DOB: 01/07/1971, 47 y.o.   MRN: [id number]  HPI 47 year old otherwise healthy female comes in today complaining of about 13 days of not feeling well.  She has been fatigued and initially had some left arm pain which she describes as a dull toothache over that entire left arm.  Then she started having some intermittent chest pain and pressure.  Initially she thought maybe it was stress.  She denied any injury or trauma or heavy lifting etc.  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  It was elevated at 144/90 which for her is high she tends to have low blood pressures.  They did an EKG which was negative but they could not rule out a heart attack so recommended that she go to the emergency department.  She was seen and evaluated at [facility] on March 4.  She is not currently complaining of any shortness of breath.  She is not on any hormone therapy.  She denies any nausea, vomiting or diarrhea.  She does have chronic constipation but says this has not really changed.  Even though she does not feel short of breath she did report that when she tried to go to church this weekend and sing in the choir she felt short of breath and she also feels a little short of breath with walking but not at rest..  No cough or wheeze.  She did check her temperature today and felt like she was running a low-grade fever of 99.  In the emergency room she had a normal EKG, normal CBC.  Negative troponin.  Negative chest x-ray.  She describes her chest pain as persistent.  It is not waxing and waning.  She also reports that her left arm has been going numb mostly at night.  She has been having a little bit of neck pain over the last couple of days as well.  In the last 5 days she is actually had persistent daily headaches over  the right frontal area as well as significant tenderness and pain over the right maxillary cheek.  She is also been congested for a couple of days as well and again felt like she had a low-grade temperature today.   Importantly on her family history her father had a heart attack at age 6 and had bypass surgery.  Review of Systems BP (!) 106/56   Pulse 76   Ht 5\' 2"  (1.575 m)   Wt 157 lb (71.2 kg)   SpO2 100%   BMI 28.72 kg/m     Allergies  Allergen Reactions  . Codeine Phosphate Itching  . Buprenorphine Hcl Rash and Swelling  . Morphine And Related Swelling and Rash    No past medical history on file.  Past Surgical History:  Procedure Laterality Date  . CESAREAN SECTION     x 2   . CHOLECYSTECTOMY  2003    Social History   Socioeconomic History  . Marital status: Married    Spouse name: Karleen Hampshire   . Number of children: 2  . Years of education: Not on file  . Highest education level: Not on file  Social Needs  . Financial resource strain: Not on file  . Food insecurity - worry: Not on file  . Food insecurity -  inability: Not on file  . Transportation needs - medical: Not on file  . Transportation needs - non-medical: Not on file  Occupational History  . Occupation: Geologist, engineeringteacher assistant    Comment: [employer]  Tobacco Use  . Smoking status: Never Smoker  . Smokeless tobacco: Never Used  Substance and Sexual Activity  . Alcohol use: No    Alcohol/week: 0.0 oz  . Drug use: No  . Sexual activity: Yes    Partners: Male  Other Topics Concern  . Not on file  Social History Narrative   Patient works as a Research officer, trade unionTeachers Assistant with [employer] she is married to [place]. And has two children. She exercises 2 days a wk for an 1 hour.  Her sister died suddenty at 7636 unknown cause and this makes her very anxious.      Family History  Problem Relation Age of Onset  . Lung cancer Father        lung (50)  . Heart failure Father        (41)  . Hyperlipidemia Father   . Breast  cancer Unknown        great aunt  . Breast cancer Unknown        cousin  . Heart attack Paternal Uncle        bypass surgery   . Breast cancer Maternal Aunt     Outpatient Encounter Medications as of 03/17/2017  Medication Sig  . albuterol (PROVENTIL HFA;VENTOLIN HFA) 108 (90 Base) MCG/ACT inhaler Inhale 2 puffs into the lungs every 6 (six) hours as needed for wheezing or shortness of breath.  . fluticasone (FLONASE) 50 MCG/ACT nasal spray Place into the nose.  . [DISCONTINUED] cyclobenzaprine (FLEXERIL) 10 MG tablet One half tab PO qHS, then increase gradually to one tab TID.  . [DISCONTINUED] meloxicam (MOBIC) 15 MG tablet One tab PO qAM with breakfast for 2 weeks, then daily prn pain.  . [DISCONTINUED] predniSONE (DELTASONE) 50 MG tablet One tab PO daily for 5 days.   No facility-administered encounter medications on file as of 03/17/2017.          Objective:   Physical Exam  Constitutional: She is oriented to person, place, and time. She appears well-developed and well-nourished.  HENT:  Head: Normocephalic and atraumatic.  Right Ear: External ear normal.  Left Ear: External ear normal.  Nose: Nose normal.  Mouth/Throat: Oropharynx is clear and moist.  TMs and canals are clear.   Eyes: Conjunctivae and EOM are normal. Pupils are equal, round, and reactive to light.  Neck: Neck supple. No thyromegaly present.  Cardiovascular: Normal rate, regular rhythm and normal heart sounds.  Pulmonary/Chest: Effort normal and breath sounds normal. She has no wheezes.  Abdominal: Soft. Bowel sounds are normal. She exhibits no distension and no mass. There is no tenderness. There is no rebound and no guarding.  Musculoskeletal: She exhibits no edema.  Lymphadenopathy:    She has no cervical adenopathy.  Neurological: She is alert and oriented to person, place, and time.  Skin: Skin is warm and dry.  Psychiatric: She has a normal mood and affect. Her behavior is normal. Judgment and thought  content normal.        Assessment & Plan:  Atypical chest pain-unclear etiology at this point.  In the emergency department, normal chest x-ray, normal EKG, normal electrolytes, normal troponin.  Repeat EKG today shows rate of 79 bpm, normal sinus rhythm with sinus arrhythmia.  No acute ST-T wave changes.  She had a  normal chest x-ray and has no signs of chest infection.  Consider pulmonary embolism so we will check stat d-dimer.  If suddenly gets worse or develops increased shortness of breath and she needs go to the emergency department immediately.  Right frontal headache and right maxillary sinus pain-most consistent with sinusitis. Will tx with augmentin. Call if not better in one week.

## 2017-03-20 NOTE — Telephone Encounter (Signed)
Order placed

## 2017-03-22 ENCOUNTER — Encounter: Payer: Self-pay | Admitting: Family Medicine

## 2017-03-22 NOTE — Addendum Note (Signed)
Addended by: Chalmers CaterUTTLE, Aelyn Stanaland H on: 03/22/2017 02:36 PM   Modules accepted: Orders

## 2017-03-24 ENCOUNTER — Encounter: Payer: Self-pay | Admitting: Family Medicine

## 2017-03-29 DIAGNOSIS — Z0000 Encounter for general adult medical examination without abnormal findings: Secondary | ICD-10-CM | POA: Diagnosis not present

## 2017-03-29 DIAGNOSIS — Z6828 Body mass index (BMI) 28.0-28.9, adult: Secondary | ICD-10-CM | POA: Diagnosis not present

## 2017-03-29 DIAGNOSIS — Z01419 Encounter for gynecological examination (general) (routine) without abnormal findings: Secondary | ICD-10-CM | POA: Diagnosis not present

## 2017-04-06 ENCOUNTER — Ambulatory Visit (INDEPENDENT_AMBULATORY_CARE_PROVIDER_SITE_OTHER): Payer: BLUE CROSS/BLUE SHIELD

## 2017-04-06 ENCOUNTER — Other Ambulatory Visit: Payer: Self-pay | Admitting: *Deleted

## 2017-04-06 DIAGNOSIS — R0602 Shortness of breath: Secondary | ICD-10-CM | POA: Diagnosis not present

## 2017-04-06 DIAGNOSIS — R079 Chest pain, unspecified: Secondary | ICD-10-CM

## 2017-04-06 DIAGNOSIS — R0789 Other chest pain: Secondary | ICD-10-CM

## 2017-04-06 LAB — EXERCISE TOLERANCE TEST
CHL CUP MPHR: 176 {beats}/min
CHL CUP RESTING HR STRESS: 97 {beats}/min
CHL RATE OF PERCEIVED EXERTION: 17
CSEPEDS: 0 s
CSEPEW: 7 METS
Exercise duration (min): 6 min
Peak HR: 179 {beats}/min
Percent HR: 101 %

## 2017-04-10 ENCOUNTER — Telehealth: Payer: Self-pay

## 2017-04-10 NOTE — Telephone Encounter (Signed)
Patient advised of recommendations. I called Dr Ludwig Clarksrenshaw's office. [facility] at 9:40 am.

## 2017-04-10 NOTE — Telephone Encounter (Signed)
Bobbyjo states Dr Ludwig Clarksrenshaw's next appointment is not until 5/29. Should she wait this long? If not we will have to call their office for a sooner appointment. Please advise.    Sheryl Taylor complains the Zantac BID is not helping with the nausea. She now has vomiting and slight diarrhea. She believes the diarrhea is due to her monthly cycle. She had vomiting once Saturday and once Sunday. She switched to omeprazole yesterday by the advice of her sister. Please advise.

## 2017-04-10 NOTE — Telephone Encounter (Signed)
I agree.  The omeprazole is a little bit stronger than the Zantac.  Recommend that she take either 20 mg twice a day or 40 mg once a day.  She will take about 20-30 minutes before first meal the day.  If taking it twice a day then take 20-30 minutes before first meal the day and at bedtime.  We could always see if they have any sooner appointments [facility].

## 2017-04-26 DIAGNOSIS — R748 Abnormal levels of other serum enzymes: Secondary | ICD-10-CM | POA: Diagnosis not present

## 2017-04-29 ENCOUNTER — Other Ambulatory Visit: Payer: Self-pay | Admitting: Sports Medicine

## 2017-04-29 DIAGNOSIS — M5136 Other intervertebral disc degeneration, lumbar region: Secondary | ICD-10-CM

## 2017-04-29 NOTE — Telephone Encounter (Signed)
To PCP

## 2017-05-01 NOTE — Progress Notes (Signed)
Referring-Catherine Metheney MD Reason for referral-chest pain  HPI: 47 year old female for evaluation of chest pain at request of Christianne Dolin MD.  Exercise tolerance test March 2019 showed borderline ST changes in V5 and V6 of approximately 1 mm in stress echocardiogram or nuclear imaging recommended.  Patient has had intermittent chest pain for several years.  It is substernal radiating to her back.  It is not exertional, pleuritic or positional.  Not related to food.  She has some dyspnea but no nausea or diaphoresis.  Her symptoms can last hours to all day.  Resolved spontaneously.  She notes some fatigue and dyspnea on exertion.  Several weeks ago she had episodes of nausea and apparently her liver functions were elevated.  These improved.  I do not have those records available.  Because of the above cardiology asked to evaluate.  Current Outpatient Medications  Medication Sig Dispense Refill  . albuterol (PROVENTIL HFA;VENTOLIN HFA) 108 (90 Base) MCG/ACT inhaler Inhale 2 puffs into the lungs every 6 (six) hours as needed for wheezing or shortness of breath. 1 Inhaler 2  . amoxicillin-clavulanate (AUGMENTIN) 875-125 MG tablet Take 1 tablet by mouth 2 (two) times daily. (Patient not taking: Reported on 05/09/2017) 20 tablet 0  . fluticasone (FLONASE) 50 MCG/ACT nasal spray Place into the nose.    . meloxicam (MOBIC) 15 MG tablet TAKE 1 TABLET BY MOUTH IN THE MORNING WITH BREAKFAST FOR 2 WEEKS THEN DAILY AS NEEDED FOR PAIN (Patient not taking: Reported on 05/09/2017) 30 tablet 2   No current facility-administered medications for this visit.     Allergies  Allergen Reactions  . Codeine Phosphate Itching  . Buprenorphine Hcl Rash and Swelling  . Morphine And Related Swelling and Rash     Past Medical History:  Diagnosis Date  . Asthma   . Bronchitis   . Hyperlipidemia   . Thyroid nodule     Past Surgical History:  Procedure Laterality Date  . CESAREAN SECTION     x 2   .  CHOLECYSTECTOMY  2003    Social History   Socioeconomic History  . Marital status: Married    Spouse name: Karleen Hampshire   . Number of children: 2  . Years of education: Not on file  . Highest education level: Not on file  Occupational History  . Occupation: Geologist, engineering    Comment: [employer]  Social Needs  . Financial resource strain: Not on file  . Food insecurity:    Worry: Not on file    Inability: Not on file  . Transportation needs:    Medical: Not on file    Non-medical: Not on file  Tobacco Use  . Smoking status: Never Smoker  . Smokeless tobacco: Never Used  Substance and Sexual Activity  . Alcohol use: No    Alcohol/week: 0.0 oz  . Drug use: No  . Sexual activity: Yes    Partners: Male  Lifestyle  . Physical activity:    Days per week: Not on file    Minutes per session: Not on file  . Stress: Not on file  Relationships  . Social connections:    Talks on phone: Not on file    Gets together: Not on file    Attends religious service: Not on file    Active member of club or organization: Not on file    Attends meetings of clubs or organizations: Not on file    Relationship status: Not on file  . Intimate partner  violence:    Fear of current or ex partner: Not on file    Emotionally abused: Not on file    Physically abused: Not on file    Forced sexual activity: Not on file  Other Topics Concern  . 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 And has two children. She exercises 2 days a wk for an 1 hour.  Her sister died suddenty at 5036 unknown cause and this makes her very anxious.      Family History  Problem Relation Age of Onset  . Lung cancer Father        lung (50)  . Heart failure Father        (41)  . Hyperlipidemia Father   . CAD Father   . Breast cancer Unknown        great aunt  . Breast cancer Unknown        cousin  . Heart attack Paternal Uncle        bypass surgery    . Breast cancer Maternal Aunt     ROS: no fevers or chills, productive cough, hemoptysis, dysphasia, odynophagia, melena, hematochezia, dysuria, hematuria, rash, seizure activity, orthopnea, PND, pedal edema, claudication. Remaining systems are negative.  Physical Exam:   Blood pressure 102/64, pulse 74, height 5\' 2"  (1.575 m), weight 153 lb 3.2 oz (69.5 kg), SpO2 99 %.  General:  Well developed/well nourished in NAD Skin warm/dry Patient not depressed No peripheral clubbing Back-normal HEENT-normal/normal eyelids Neck supple/normal carotid upstroke bilaterally; no bruits; no JVD; no thyromegaly chest - CTA/ normal expansion CV - RRR/normal S1 and S2; no murmurs, rubs or gallops;  PMI nondisplaced Abdomen -NT/ND, no HSM, no mass, + bowel sounds, no bruit 2+ femoral pulses, no bruits Ext-no edema, chords, 2+ DP Neuro-grossly nonfocal  ECG -March 17, 2017-sinus rhythm with no ST changes.  Personally reviewed  A/P  1 chest pain-symptoms are atypical.  However exercise treadmill borderline positive.  Possibly false positive.  I will plan a cardiac CTA for definitive evaluation particularly in light of positive family history.  2 recent elevated liver functions-I do not have those records available.  I have asked her to follow-up with primary care for this issue.  3 hyperlipidemia-management per primary care.  Olga MillersBrian Anastasha Ortez, MD

## 2017-05-09 ENCOUNTER — Encounter: Payer: Self-pay | Admitting: Cardiology

## 2017-05-09 ENCOUNTER — Ambulatory Visit: Payer: BLUE CROSS/BLUE SHIELD | Admitting: Cardiology

## 2017-05-09 ENCOUNTER — Encounter: Payer: Self-pay | Admitting: *Deleted

## 2017-05-09 ENCOUNTER — Encounter

## 2017-05-09 VITALS — BP 102/64 | HR 74 | Ht 62.0 in | Wt 153.2 lb

## 2017-05-09 DIAGNOSIS — R072 Precordial pain: Secondary | ICD-10-CM

## 2017-05-09 DIAGNOSIS — E7800 Pure hypercholesterolemia, unspecified: Secondary | ICD-10-CM

## 2017-05-09 MED ORDER — METOPROLOL TARTRATE 50 MG PO TABS: ORAL_TABLET | ORAL | 3 refills | Status: DC

## 2017-05-09 NOTE — Patient Instructions (Signed)
Your physician has requested that you have cardiac CT. Cardiac computed tomography (CT) is a painless test that uses an x-ray machine to take clear, detailed pictures of your heart. For further information please visit [email or web address] Please follow instruction sheet as given.   Please arrive at the [facility] main entrance of [facility] at xx:xx AM (30-45 minutes prior to test start time)  [facility] [address] [place], [place] [address] [phone]  Proceed to the [facility] Radiology Department (First Floor).  Please follow these instructions carefully (unless otherwise directed):  Hold all erectile dysfunction medications at least 48 hours prior to test.  On the Night Before the Test: . Drink plenty of water. . Do not consume any caffeinated/decaffeinated beverages or chocolate 12 hours prior to your test. . Do not take any antihistamines 12 hours prior to your test. . If you take Metformin do not take 24 hours prior to test. . If the patient has contrast allergy: ? Patient will need a prescription for Prednisone and very clear instructions (as follows): 1. Prednisone 50 mg - take 13 hours prior to test 2. Take another Prednisone 50 mg 7 hours prior to test 3. Take another Prednisone 50 mg 1 hour prior to test 4. Take Benadryl 50 mg 1 hour prior to test . Patient must complete all four doses of above prophylactic medications. . Patient will need a ride after test due to Benadryl.  On the Day of the Test: . Drink plenty of water. Do not drink any water within one hour of the test. . Do not eat any food 4 hours prior to the test. . You may take your regular medications prior to the test. . IF NOT ON A BETA BLOCKER - Take 50 mg of lopressor (metoprolol) one hour before the test. . HOLD Furosemide morning of the test.  After the Test: . Drink plenty of water. . After receiving IV contrast, you may experience a mild flushed feeling. This is  normal. . On occasion, you may experience a mild rash up to 24 hours after the test. This is not dangerous. If this occurs, you can take Benadryl 25 mg and increase your fluid intake. . If you experience trouble breathing, this can be serious. If it is severe call 911 IMMEDIATELY. If it is mild, please call our office. . If you take any of these medications: Glipizide/Metformin, Avandament, Glucavance, please do not take 48 hours after completing test.

## 2017-05-30 ENCOUNTER — Telehealth: Payer: Self-pay | Admitting: Cardiology

## 2017-05-30 DIAGNOSIS — R072 Precordial pain: Secondary | ICD-10-CM

## 2017-05-30 MED ORDER — METOPROLOL TARTRATE 50 MG PO TABS: ORAL_TABLET | ORAL | 0 refills | Status: DC

## 2017-05-30 NOTE — Telephone Encounter (Signed)
New Message    *STAT* If patient is at the pharmacy, call can be transferred to refill team.   1. Which medications need to be refilled? (please list name of each medication and dose if known) metoprolol tartrate   2. Which pharmacy/location (including street and city if local pharmacy) is medication to be sent to? [facility]. Do they need a 30 day or 90 day supply?    Patient is to take this rx once but 180 pills was called in. She needs a new rx sent.

## 2017-06-06 ENCOUNTER — Telehealth: Payer: Self-pay | Admitting: Cardiology

## 2017-06-06 NOTE — Telephone Encounter (Signed)
Pt was instructed to take Metoprolol 1 hour prior to having CT ordered by Dr. Jens Som, pt would like to know what thie med is for and why she needs to take it prior to test?

## 2017-06-06 NOTE — Telephone Encounter (Signed)
Spoke with pt, questions regarding lopressor and CT scan answered.

## 2017-06-07 ENCOUNTER — Ambulatory Visit: Payer: BLUE CROSS/BLUE SHIELD | Admitting: Cardiology

## 2017-06-09 ENCOUNTER — Ambulatory Visit (HOSPITAL_COMMUNITY): Payer: BLUE CROSS/BLUE SHIELD

## 2017-06-09 ENCOUNTER — Ambulatory Visit (HOSPITAL_COMMUNITY)
Admission: RE | Admit: 2017-06-09 | Discharge: 2017-06-09 | Disposition: A | Discharge status: Home / Self Care | Payer: BLUE CROSS/BLUE SHIELD | Source: Ambulatory Visit | Attending: Cardiology | Admitting: Cardiology

## 2017-06-09 DIAGNOSIS — K449 Diaphragmatic hernia without obstruction or gangrene: Secondary | ICD-10-CM | POA: Diagnosis not present

## 2017-06-09 DIAGNOSIS — R072 Precordial pain: Secondary | ICD-10-CM | POA: Diagnosis not present

## 2017-06-09 DIAGNOSIS — R079 Chest pain, unspecified: Secondary | ICD-10-CM | POA: Diagnosis not present

## 2017-06-09 MED ORDER — METOPROLOL TARTRATE 5 MG/5ML IV SOLN
INTRAVENOUS | Status: AC
  Filled 2017-06-09: qty 15

## 2017-06-09 MED ORDER — NITROGLYCERIN 0.4 MG SL SUBL
SUBLINGUAL_TABLET | SUBLINGUAL | Status: AC
  Filled 2017-06-09: qty 2

## 2017-06-09 MED ORDER — IOPAMIDOL (ISOVUE-370) INJECTION 76%
INTRAVENOUS | Status: AC
  Filled 2017-06-09: qty 100

## 2017-06-09 MED ORDER — METOPROLOL TARTRATE 5 MG/5ML IV SOLN
5.0000 mg | INTRAVENOUS | Status: DC | PRN
  Administered 2017-06-09 (×3): 5 mg via INTRAVENOUS

## 2017-06-09 MED ORDER — NITROGLYCERIN 0.4 MG SL SUBL
0.8000 mg | SUBLINGUAL_TABLET | Freq: Once | SUBLINGUAL | Status: AC
  Administered 2017-06-09: 0.8 mg via SUBLINGUAL

## 2017-06-09 MED ORDER — IOPAMIDOL (ISOVUE-370) INJECTION 76%
100.0000 mL | Freq: Once | INTRAVENOUS | Status: AC | PRN
  Administered 2017-06-09: 100 mL via INTRAVENOUS

## 2017-06-12 ENCOUNTER — Encounter: Payer: Self-pay | Admitting: Cardiology

## 2017-06-12 NOTE — Telephone Encounter (Signed)
This encounter was created in error - please disregard.

## 2017-06-12 NOTE — Telephone Encounter (Signed)
Pt calling and stated she need nurse to call and give her results of her CT she had on Friday. [facility]

## 2017-07-31 ENCOUNTER — Encounter: Payer: BLUE CROSS/BLUE SHIELD | Admitting: Family Medicine

## 2017-07-31 ENCOUNTER — Telehealth: Payer: Self-pay

## 2017-07-31 DIAGNOSIS — Z0000 Encounter for general adult medical examination without abnormal findings: Secondary | ICD-10-CM

## 2017-07-31 NOTE — Telephone Encounter (Signed)
Labs ordered.

## 2017-07-31 NOTE — Telephone Encounter (Signed)
Pt advised.

## 2017-07-31 NOTE — Telephone Encounter (Signed)
Seleen would like to go ahead and have her annual physical labs drawn. Please advise which labs needed.

## 2017-08-02 DIAGNOSIS — R7989 Other specified abnormal findings of blood chemistry: Secondary | ICD-10-CM | POA: Diagnosis not present

## 2017-08-02 DIAGNOSIS — Z0000 Encounter for general adult medical examination without abnormal findings: Secondary | ICD-10-CM | POA: Diagnosis not present

## 2017-08-02 LAB — COMPLETE METABOLIC PANEL WITH GFR
AG Ratio: 1.7 (calc) (ref 1.0–2.5)
ALKALINE PHOSPHATASE (APISO): 83 U/L (ref 33–115)
ALT: 19 U/L (ref 6–29)
AST: 19 U/L (ref 10–35)
Albumin: 4.2 g/dL (ref 3.6–5.1)
BILIRUBIN TOTAL: 0.5 mg/dL (ref 0.2–1.2)
BUN: 18 mg/dL (ref 7–25)
CHLORIDE: 105 mmol/L (ref 98–110)
CO2: 29 mmol/L (ref 20–32)
Calcium: 9.1 mg/dL (ref 8.6–10.2)
Creat: 0.98 mg/dL (ref 0.50–1.10)
GFR, Est African American: 81 mL/min/{1.73_m2} (ref >=60)
GFR, Est Non African American: 70 mL/min/{1.73_m2} (ref >=60)
GLUCOSE: 95 mg/dL (ref 65–99)
Globulin: 2.5 g/dL (calc) (ref 1.9–3.7)
Potassium: 4.8 mmol/L (ref 3.5–5.3)
Sodium: 140 mmol/L (ref 135–146)
TOTAL PROTEIN: 6.7 g/dL (ref 6.1–8.1)

## 2017-08-02 LAB — LIPID PANEL
CHOLESTEROL: 191 mg/dL (ref <200)
HDL: 55 mg/dL (ref >50)
LDL CHOLESTEROL (CALC): 121 mg/dL — AB
Non-HDL Cholesterol (Calc): 136 mg/dL (calc) — ABNORMAL HIGH (ref <130)
TRIGLYCERIDES: 54 mg/dL (ref <150)
Total CHOL/HDL Ratio: 3.5 (calc) (ref <5.0)

## 2017-08-02 LAB — CBC
HEMATOCRIT: 37.9 % (ref 35.0–45.0)
Hemoglobin: 12.5 g/dL (ref 11.7–15.5)
MCH: 27.1 pg (ref 27.0–33.0)
MCHC: 33 g/dL (ref 32.0–36.0)
MCV: 82 fL (ref 80.0–100.0)
MPV: 10.3 fL (ref 7.5–12.5)
Platelets: 288 10*3/uL (ref 140–400)
RBC: 4.62 10*6/uL (ref 3.80–5.10)
RDW: 13.9 % (ref 11.0–15.0)
WBC: 4.7 10*3/uL (ref 3.8–10.8)

## 2017-08-02 LAB — TSH: TSH: 2.21 mIU/L

## 2017-08-04 ENCOUNTER — Ambulatory Visit (INDEPENDENT_AMBULATORY_CARE_PROVIDER_SITE_OTHER): Payer: BLUE CROSS/BLUE SHIELD | Admitting: Family Medicine

## 2017-08-04 ENCOUNTER — Encounter: Payer: Self-pay | Admitting: Family Medicine

## 2017-08-04 VITALS — BP 101/58 | HR 80 | Ht 62.0 in | Wt 155.0 lb

## 2017-08-04 DIAGNOSIS — Z0000 Encounter for general adult medical examination without abnormal findings: Secondary | ICD-10-CM

## 2017-08-04 DIAGNOSIS — J029 Acute pharyngitis, unspecified: Secondary | ICD-10-CM | POA: Diagnosis not present

## 2017-08-04 DIAGNOSIS — M5432 Sciatica, left side: Secondary | ICD-10-CM | POA: Diagnosis not present

## 2017-08-04 DIAGNOSIS — M5136 Other intervertebral disc degeneration, lumbar region: Secondary | ICD-10-CM

## 2017-08-04 MED ORDER — CYCLOBENZAPRINE HCL 10 MG PO TABS: 5.0000 mg | ORAL_TABLET | Freq: Three times a day (TID) | ORAL | 0 refills | Status: DC | PRN

## 2017-08-04 MED ORDER — KETOROLAC TROMETHAMINE 60 MG/2ML IM SOLN
60.0000 mg | Freq: Once | INTRAMUSCULAR | Status: AC
  Administered 2017-08-04: 60 mg via INTRAMUSCULAR

## 2017-08-04 MED ORDER — PREDNISONE 20 MG PO TABS: 40.0000 mg | ORAL_TABLET | Freq: Every day | ORAL | 0 refills | Status: DC

## 2017-08-04 NOTE — Patient Instructions (Signed)

## 2017-08-04 NOTE — Progress Notes (Signed)
Subjective:     Sheryl Taylor is a 47 y.o. female and is here for a comprehensive physical exam. The patient reports problems - her back starting hurting last night. She does not remember any injury or the heavy lifting etc.  Just started to hurt particularly on the left side just lateral to the lumbar spine and now she is getting pain radiating down her left buttock to about mid outer thigh.  She is been using Motrin and a heating pad.  She says it hurts to walk it hurts to sit for long periods and it hurts to bend forward.  She has used muscle relaxers before but does not have any right now.  She has not been able to do any home stretches that she has had physical therapy previously.  Has a history of degenerative disc disease.  [facility] and her last Pap smear was March 18, 2015.  She did have negative cotesting at the time.  She also recently had a coronary CT for her heart which was essentially negative but she was noted to have a hiatal hernia so we have added that to her problem list.  She also had a sore throat last night but says is little better today.  She has not had any cold symptoms.  Because she is in pain today she declines a Tdap.  She has not been exercising recently.  Social History   Socioeconomic History  . Marital status: Married    Spouse name: Karleen HampshireSpencer   . Number of children: 2  . Years of education: Not on file  . Highest education level: Not on file  Occupational History  . Occupation: Geologist, engineeringteacher assistant    Comment: [employer]  Social Needs  . Financial resource strain: Not on file  . Food insecurity:    Worry: Not on file    Inability: Not on file  . Transportation needs:    Medical: Not on file    Non-medical: Not on file  Tobacco Use  . Smoking status: Never Smoker  . Smokeless tobacco: Never Used  Substance and Sexual Activity  . Alcohol use: No    Alcohol/week: 0.0 oz  . Drug use: No  . Sexual activity: Yes    Partners: Male   Lifestyle  . Physical activity:    Days per week: Not on file    Minutes per session: Not on file  . Stress: Not on file  Relationships  . Social connections:    Talks on phone: Not on file    Gets together: Not on file    Attends religious service: Not on file    Active member of club or organization: Not on file    Attends meetings of clubs or organizations: Not on file    Relationship status: Not on file  . Intimate partner violence:    Fear of current or ex partner: Not on file    Emotionally abused: Not on file    Physically abused: Not on file    Forced sexual activity: Not on file  Other Topics Concern  . 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 And has two children. She exercises 2 days a wk for an 1 hour.  Her sister died suddenty at 8136 unknown cause and this makes her very anxious.     Health Maintenance  Topic Date Due  . PAP SMEAR  08/04/2017 (Originally  03/11/2017)  . INFLUENZA VACCINE  09/15/2017 (Originally 08/10/2017)  . TETANUS/TDAP  08/05/2018 (Originally 10/01/2016)  . MAMMOGRAM  03/07/2018  . HIV Screening  Completed    The following portions of the patient's history were reviewed and updated as appropriate: allergies, current medications, past family history, past medical history, past social history, past surgical history and problem list.  Review of Systems A comprehensive review of systems was negative.   Objective:    BP (!) 101/58   Pulse 80   Ht 5\' 2"  (1.575 m)   Wt 155 lb (70.3 kg)   BMI 28.35 kg/m  General appearance: alert, cooperative and appears stated age Head: Normocephalic, without obvious abnormality, atraumatic Eyes: conj clear, EOMi , PEERLA Ears: normal TM's and external ear canals both ears Nose: Nares normal. Septum midline. Mucosa normal. No drainage or sinus tenderness. Throat: lips, mucosa, and tongue normal; teeth and gums normal Neck: no adenopathy,  no carotid bruit, no JVD, supple, symmetrical, trachea midline and thyroid not enlarged, symmetric, no tenderness/mass/nodules Back: symmetric, no curvature. ROM normal. No CVA tenderness. Lungs: clear to auscultation bilaterally Heart: regular rate and rhythm, S1, S2 normal, no murmur, click, rub or gallop Abdomen: soft, non-tender; bowel sounds normal; no masses,  no organomegaly Extremities: extremities normal, atraumatic, no cyanosis or edema Pulses: 2+ and symmetric Skin: Skin color, texture, turgor normal. No rashes or lesions Lymph nodes: Cervical adenopathy: nl and Supraclavicular adenopathy: nl Neurologic: Alert and oriented X 3, normal strength and tone. Normal symmetric reflexes. Normal coordination and gait    Assessment:    Healthy female exam.      Plan:     See After Visit Summary for Counseling Recommendations   Keep up a regular exercise program and make sure you are eating a healthy diet Try to eat 4 servings of dairy a day, or if you are lactose intolerant take a calcium with vitamin D daily.  Declined Tdap.   Acute low back pain with radiculopathy/sciatica-we will treat acutely with Toradol injection IM.  We will send over prescription for oral prednisone for 5 days as well as a muscle relaxer.  She is to avoid taking any NSAIDs while on the prednisone but can use Tylenol.  Encouraged her to start doing the cat and camel stretch exercise.  Handout provided.  If not improving over the next couple weeks and please let us know.  Pharyngitis with no other upper respiratory symptoms-exam is benign today but if symptoms do not improve then please let us know.

## 2017-09-15 ENCOUNTER — Encounter: Payer: Self-pay | Admitting: Family Medicine

## 2017-09-15 DIAGNOSIS — J0100 Acute maxillary sinusitis, unspecified: Secondary | ICD-10-CM

## 2017-09-15 DIAGNOSIS — J301 Allergic rhinitis due to pollen: Secondary | ICD-10-CM

## 2017-09-15 DIAGNOSIS — H938X3 Other specified disorders of ear, bilateral: Secondary | ICD-10-CM

## 2017-09-15 NOTE — Telephone Encounter (Signed)
Referral placed.

## 2017-09-29 DIAGNOSIS — H6122 Impacted cerumen, left ear: Secondary | ICD-10-CM | POA: Diagnosis not present

## 2017-09-29 DIAGNOSIS — H6983 Other specified disorders of Eustachian tube, bilateral: Secondary | ICD-10-CM | POA: Diagnosis not present

## 2017-10-20 DIAGNOSIS — H6122 Impacted cerumen, left ear: Secondary | ICD-10-CM | POA: Diagnosis not present

## 2017-10-20 DIAGNOSIS — H6983 Other specified disorders of Eustachian tube, bilateral: Secondary | ICD-10-CM | POA: Diagnosis not present

## 2017-10-26 ENCOUNTER — Ambulatory Visit (INDEPENDENT_AMBULATORY_CARE_PROVIDER_SITE_OTHER): Payer: BLUE CROSS/BLUE SHIELD | Admitting: Family Medicine

## 2017-10-26 ENCOUNTER — Other Ambulatory Visit: Payer: Self-pay | Admitting: Family Medicine

## 2017-10-26 ENCOUNTER — Encounter: Payer: Self-pay | Admitting: Family Medicine

## 2017-10-26 VITALS — BP 105/58 | HR 76 | Ht 62.0 in | Wt 161.0 lb

## 2017-10-26 DIAGNOSIS — R21 Rash and other nonspecific skin eruption: Secondary | ICD-10-CM | POA: Diagnosis not present

## 2017-10-26 MED ORDER — VALACYCLOVIR HCL 1 G PO TABS: 1000.0000 mg | ORAL_TABLET | Freq: Three times a day (TID) | ORAL | 0 refills | Status: DC

## 2017-10-26 MED ORDER — PRAMOXINE-CALAMINE 1-3 % EX LOTN: 1.0000 "application " | TOPICAL_LOTION | Freq: Four times a day (QID) | CUTANEOUS | 0 refills | Status: DC | PRN

## 2017-10-26 MED ORDER — TRIAMCINOLONE ACETONIDE 0.5 % EX OINT: 1.0000 "application " | TOPICAL_OINTMENT | Freq: Every day | CUTANEOUS | 0 refills | Status: DC

## 2017-10-26 NOTE — Patient Instructions (Signed)

## 2017-10-26 NOTE — Progress Notes (Signed)
Subjective:    Patient ID: Sheryl Taylor, female    DOB: [birth year], 47 y.o.   MRN: [id number]  HPI 47 yo female comes in today c/o of rash on neck yesterday. no changes in lotions,soaps,detergent,diet. she stated that the area is itchy and stings.  She says she is actually been having pain in that neck area for about a week and then when she reached back last night she actually felt what felt like a rash or some lumpiness to the area.  She had her daughter look at it and it actually had almost what look like vesicles.  She says that it tingles and itches and is still painful and uncomfortable in her neck.  She has had shingles before but it was on her right torso underneath her right breast.  She has not had any fevers chills or sweats. Or alleviating factors.  No known triggers.  Review of Systems  BP (!) 105/58   Pulse 76   Ht 5\' 2"  (1.575 m)   Wt 161 lb (73 kg)   SpO2 98%   BMI 29.45 kg/m     Allergies  Allergen Reactions  . Codeine Phosphate Itching  . Buprenorphine Hcl Rash and Swelling  . Morphine And Related Swelling and Rash    Past Medical History:  Diagnosis Date  . Asthma   . Bronchitis   . Hyperlipidemia   . Thyroid nodule     Past Surgical History:  Procedure Laterality Date  . CESAREAN SECTION     x 2   . CHOLECYSTECTOMY  2003    Social History   Socioeconomic History  . Marital status: Married    Spouse name: Karleen Hampshire   . Number of children: 2  . Years of education: Not on file  . Highest education level: Not on file  Occupational History  . Occupation: Geologist, engineering    Comment: [employer]  Social Needs  . Financial resource strain: Not on file  . Food insecurity:    Worry: Not on file    Inability: Not on file  . Transportation needs:    Medical: Not on file    Non-medical: Not on file  Tobacco Use  . Smoking status: Never Smoker  . Smokeless tobacco: Never Used  Substance and Sexual Activity  . Alcohol use: No    Alcohol/week: 0.0  standard drinks  . Drug use: No  . Sexual activity: Yes    Partners: Male  Lifestyle  . Physical activity:    Days per week: Not on file    Minutes per session: Not on file  . Stress: Not on file  Relationships  . Social connections:    Talks on phone: Not on file    Gets together: Not on file    Attends religious service: Not on file    Active member of club or organization: Not on file    Attends meetings of clubs or organizations: Not on file    Relationship status: Not on file  . Intimate partner violence:    Fear of current or ex partner: Not on file    Emotionally abused: Not on file    Physically abused: Not on file    Forced sexual activity: Not on file  Other Topics Concern  . 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 She exercises 2 days a wk for an 1 hour.  Her sister died suddenty at 77 unknown cause and this makes her very anxious.      Family History  Problem Relation Age of Onset  . Lung cancer Father        lung (50)  . Heart failure Father        (41)  . Hyperlipidemia Father   . CAD Father   . Breast cancer Unknown        great aunt  . Breast cancer Unknown        cousin  . Heart attack Paternal Uncle        bypass surgery   . Breast cancer Maternal Aunt     Outpatient Encounter Medications as of 10/26/2017  Medication Sig  . cyclobenzaprine (FLEXERIL) 10 MG tablet Take 0.5-1 tablets (5-10 mg total) by mouth 3 (three) times daily as needed for muscle spasms.  . fluticasone (FLONASE) 50 MCG/ACT nasal spray Place into the nose.  . Pramoxine-Calamine 1-3 % LOTN Apply 1 application topically 4 (four) times daily as needed.  . triamcinolone ointment (KENALOG) 0.5 % Apply 1 application topically at bedtime.  . valACYclovir (VALTREX) 1000 MG tablet Take 1 tablet (1,000 mg total) by mouth 3 (three) times daily.  . [DISCONTINUED] albuterol (PROVENTIL  HFA;VENTOLIN HFA) 108 (90 Base) MCG/ACT inhaler Inhale 2 puffs into the lungs every 6 (six) hours as needed for wheezing or shortness of breath.  . [DISCONTINUED] predniSONE (DELTASONE) 20 MG tablet Take 2 tablets (40 mg total) by mouth daily with breakfast.   No facility-administered encounter medications on file as of 10/26/2017.          Objective:   Physical Exam  Constitutional: She is oriented to person, place, and time. She appears well-developed and well-nourished.  HENT:  Head: Normocephalic and atraumatic.  Eyes: Conjunctivae and EOM are normal.  Cardiovascular: Normal rate.  Pulmonary/Chest: Effort normal.  Neurological: She is alert and oriented to person, place, and time.  Skin: Skin is dry. No pallor.  She does have a rash as below in the photograph it is very maculopapular.  There are some areas that almost looks like it they are trying to raise up into the vesicles but not enough that I felt like I could get a good swab for culture for confirmation.   Psychiatric: She has a normal mood and affect. Her behavior is normal.  Vitals reviewed.           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  He did have pain for a week before the rash appeared in this area which is very classic for shingles.  Consider though it still could be something else like a contact dermatitis that she has not changed any soaps lotions detergents etc. Can alternate IBU and tylenol for pain. If not helping please let me know.

## 2017-10-26 NOTE — Telephone Encounter (Signed)
Routing to pcp for change.Sheryl Taylor, CMA

## 2017-10-29 ENCOUNTER — Encounter: Payer: Self-pay | Admitting: Family Medicine

## 2017-11-17 ENCOUNTER — Ambulatory Visit (INDEPENDENT_AMBULATORY_CARE_PROVIDER_SITE_OTHER): Payer: BLUE CROSS/BLUE SHIELD | Admitting: Family Medicine

## 2017-11-17 ENCOUNTER — Ambulatory Visit: Payer: BLUE CROSS/BLUE SHIELD | Admitting: Physician Assistant

## 2017-11-17 ENCOUNTER — Encounter: Payer: Self-pay | Admitting: Family Medicine

## 2017-11-17 VITALS — BP 111/62 | HR 94 | Ht 62.0 in | Wt 161.0 lb

## 2017-11-17 DIAGNOSIS — R234 Changes in skin texture: Secondary | ICD-10-CM

## 2017-11-17 DIAGNOSIS — R3915 Urgency of urination: Secondary | ICD-10-CM | POA: Diagnosis not present

## 2017-11-17 DIAGNOSIS — R21 Rash and other nonspecific skin eruption: Secondary | ICD-10-CM

## 2017-11-17 LAB — POCT URINALYSIS DIPSTICK
BILIRUBIN UA: NEGATIVE
GLUCOSE UA: NEGATIVE
Ketones, UA: NEGATIVE
Nitrite, UA: NEGATIVE
Protein, UA: NEGATIVE
RBC UA: NEGATIVE
SPEC GRAV UA: 1.025 (ref 1.010–1.025)
Urobilinogen, UA: 0.2 E.U./dL
pH, UA: 5.5 (ref 5.0–8.0)

## 2017-11-17 MED ORDER — MUPIROCIN 2 % EX OINT: TOPICAL_OINTMENT | CUTANEOUS | 0 refills | Status: DC

## 2017-11-17 MED ORDER — SULFAMETHOXAZOLE-TRIMETHOPRIM 800-160 MG PO TABS: 1.0000 | ORAL_TABLET | Freq: Two times a day (BID) | ORAL | 0 refills | Status: DC

## 2017-11-17 NOTE — Progress Notes (Signed)
   Subjective:    Patient ID: Sheryl Taylor, female    DOB: 13-May-1970, 47 y.o.   MRN: [id number]  HPI 47 year old female comes in today for rash on her upper lip.  She does have a history of cold sores.  She said she felt a bump that was felt like it was on the inside of her mouth on the upper lip and then after she brushed her teeth she felt like a little moisture on her upper lip and then it turned red and now has a scab on it.  She has been on treatment for shingles which is presently on her left upper shoulder and neck.  So she is been on valacyclovir.  She says it feels a little itchy and burning similar to her cold sores.  She is also had some urinary urgency for the last several days.  She is getting a little bit of pelvic pressure at the end of urination but no burning.  No fevers chills or sweats.  No blood in the urine.   Review of Systems     Objective:   Physical Exam  Constitutional: She is oriented to person, place, and time. She appears well-developed and well-nourished.  HENT:  Head: Normocephalic and atraumatic.  Eyes: Conjunctivae and EOM are normal.  Cardiovascular: Normal rate.  Pulmonary/Chest: Effort normal.  Neurological: She is alert and oriented to person, place, and time.  Skin: Skin is dry. No pallor.  On her upper lip she has 2 scabs right in the center over the vermiform plexus.  On her left upper back and shoulder she still has a few very small papules in the little pinkness to the skin.  Psychiatric: She has a normal mood and affect. Her behavior is normal.  Vitals reviewed.       Assessment & Plan:  Rash on upper lip-most consistent with cold sores.  We discussed possibly another round of valacyclovir since she did complete the course.  It is a little unusual since she just had an extended treatment.  Also sent over prescription for mupirocin ointment in case it continues to be red and irritated and may be secondarily infected.  Urinary urgency-urine  dipstick positive for leukocytes.  I did give her a prescription to fill over the weekend if she is not improving or if she suddenly gets worse.  Call if not getting better.  I did not send a culture today.

## 2017-11-24 ENCOUNTER — Encounter: Payer: Self-pay | Admitting: Family Medicine

## 2018-02-21 ENCOUNTER — Ambulatory Visit (INDEPENDENT_AMBULATORY_CARE_PROVIDER_SITE_OTHER): Payer: BLUE CROSS/BLUE SHIELD | Admitting: Family Medicine

## 2018-02-21 ENCOUNTER — Encounter: Payer: Self-pay | Admitting: Family Medicine

## 2018-02-21 VITALS — BP 126/87 | HR 85

## 2018-02-21 DIAGNOSIS — M5136 Other intervertebral disc degeneration, lumbar region: Secondary | ICD-10-CM | POA: Diagnosis not present

## 2018-02-21 DIAGNOSIS — S39012A Strain of muscle, fascia and tendon of lower back, initial encounter: Secondary | ICD-10-CM

## 2018-02-21 MED ORDER — HYDROCODONE-ACETAMINOPHEN 5-325 MG PO TABS: 1.0000 | ORAL_TABLET | Freq: Four times a day (QID) | ORAL | 0 refills | Status: DC | PRN

## 2018-02-21 MED ORDER — PREDNISONE 5 MG (48) PO TBPK: ORAL_TABLET | ORAL | 0 refills | Status: DC

## 2018-02-21 MED ORDER — METHOCARBAMOL 500 MG PO TABS: 500.0000 mg | ORAL_TABLET | Freq: Three times a day (TID) | ORAL | 0 refills | Status: DC | PRN

## 2018-02-21 MED ORDER — METHYLPREDNISOLONE ACETATE 80 MG/ML IJ SUSP
80.0000 mg | Freq: Once | INTRAMUSCULAR | Status: AC
  Administered 2018-02-21: 80 mg via INTRAMUSCULAR

## 2018-02-21 NOTE — Progress Notes (Signed)
Sheryl Taylor is a 48 y.o. female who presents to [facility] today for low back pain.  Sheryl Taylor was in her normal state of health yesterday.  She was bending forward to put her pants on and developed immediate pain in her low back.  She had severe pain in the left side of her low back.  She notes some pain referring down the posterior thigh and lateral thighs bilaterally.  She denies any pain radiating below the level of her knee.  She also notes some pain radiating referring down to the anterior groin and hip on the left side. She denies any numbness weakness or bowel bladder dysfunction.  She does note significant pain standing from a seated position.  She has difficulty with motion.  Pain is somewhat better with rest but her pain is severe.  She is tried ibuprofen which has not helped.  Additionally she is tried heating pad which helps a little.  She has had similar episodes in the past that got better in a week or 2 typically after injection in clinic.  She is had trials of Flexeril in the past which were not helpful.    ROS:  As above  Exam:  BP 126/87   Pulse 85  Wt Readings from Last 5 Encounters:  11/17/17 161 lb (73 kg)  10/26/17 161 lb (73 kg)  08/04/17 155 lb (70.3 kg)  05/09/17 153 lb 3.2 oz (69.5 kg)  03/17/17 157 lb (71.2 kg)   General: Well Developed, well nourished, and in pain appearing Neuro/Psych: Alert and oriented x3, extra-ocular muscles intact, able to move all 4 extremities, sensation grossly intact. Skin: Warm and dry, no rashes noted.  Respiratory: Not using accessory muscles, speaking in full sentences, trachea midline.  Cardiovascular: Pulses palpable, no extremity edema. Abdomen: Does not appear distended. MSK:  L-spine: Nontender to spinal midline. Tender palpation left lumbar paraspinal musculature and into the left SI joint region. Lumbar motion profoundly limited range of motion.  Significant pain with  flexion.  Limited rotation and lateral flexion bilaterally.  Some extension present. Reflexes are intact bilateral knees and ankles. Strength is intact however patient is very painful.  She is able to stand on her heels and her toes.  She can stand from a seated position.  She has an antalgic gait. Sensation is intact throughout.  Patient was given 80 mg of Depo-Medrol injection IM prior to discharge.  Lab and Radiology Results  EXAM: LUMBAR SPINE - COMPLETE 4+ VIEW  COMPARISON:  Lumbar spine series of September 26, 2014  FINDINGS: The lumbar vertebral bodies are preserved in height. There is mild disc space narrowing at L4-5. There is no spondylolisthesis. There is no significant facet joint hypertrophy. The pedicles and transverse processes are intact. The observed portions of the sacrum are normal.  IMPRESSION: There is no acute bony abnormality of the lumbar spine. There has developed mild disc space narrowing at L4-5 since the previous study. A bony spur overlying the posterior inferior aspect of the body of L3 seen on the previous study is not clearly evident on today's study.   Electronically Signed   By: David  [place] M.D.   On: 02/06/2017 16:30 I personally (independently) visualized and performed the interpretation of the images attached in this note.    Assessment and Plan: 48 y.o. female with Severe lumbar pain. Likely related to myofascial strain or spasm.  Doubtful for significant lumbar radicular component.  Patient could have L2 or L3  lumbar radicular pain to the left tear hip or groin however this is doubtful as she prefers a seated flexed position.  Plan for steroid injection, prednisone course, hydrocodone for pain, and methocarbamol.  Patient declined physical therapy referral.  If not improving neck step would be physical therapy and likely x-ray L-spine.   PDMP reviewed during this encounter. No orders of the defined types were placed in this  encounter.  Meds ordered this encounter  Medications  . predniSONE (STERAPRED UNI-PAK 48 TAB) 5 MG (48) TBPK tablet    Sig: 12 day dosepack po    Dispense:  48 tablet    Refill:  0  . methocarbamol (ROBAXIN) 500 MG tablet    Sig: Take 1 tablet (500 mg total) by mouth every 8 (eight) hours as needed for muscle spasms.    Dispense:  30 tablet    Refill:  0  . HYDROcodone-acetaminophen (NORCO/VICODIN) 5-325 MG tablet    Sig: Take 1 tablet by mouth every 6 (six) hours as needed.    Dispense:  15 tablet    Refill:  0    Historical information moved to improve visibility of documentation.  Past Medical History:  Diagnosis Date  . Asthma   . Bronchitis   . Hyperlipidemia   . Thyroid nodule    Past Surgical History:  Procedure Laterality Date  . CESAREAN SECTION     x 2   . CHOLECYSTECTOMY  2003   Social History   Tobacco Use  . Smoking status: Never Smoker  . Smokeless tobacco: Never Used  Substance Use Topics  . Alcohol use: No    Alcohol/week: 0.0 standard drinks   family history includes Breast cancer in her maternal aunt, unknown relative, and unknown relative; CAD in her father; Heart attack in her paternal uncle; Heart failure in her father; Hyperlipidemia in her father; Lung cancer in her father.  Medications: Current Outpatient Medications  Medication Sig Dispense Refill  . fluticasone (FLONASE) 50 MCG/ACT nasal spray Place into the nose.    . mupirocin ointment (BACTROBAN) 2 % Apply to inside of each nares daily for 10 days then twice a week for maintenance. 30 g 0  . pramoxine (PROCTOFOAM) 1 % foam APPLY 1 APPLICATION TOPICALLY 4 (FOUR) TIMES DAILY AS NEEDED. 120 g 0  . triamcinolone ointment (KENALOG) 0.5 % Apply 1 application topically at bedtime. 15 g 0  . valACYclovir (VALTREX) 1000 MG tablet Take 1 tablet (1,000 mg total) by mouth 3 (three) times daily. 21 tablet 0  . HYDROcodone-acetaminophen (NORCO/VICODIN) 5-325 MG tablet Take 1 tablet by mouth every 6  (six) hours as needed. 15 tablet 0  . methocarbamol (ROBAXIN) 500 MG tablet Take 1 tablet (500 mg total) by mouth every 8 (eight) hours as needed for muscle spasms. 30 tablet 0  . predniSONE (STERAPRED UNI-PAK 48 TAB) 5 MG (48) TBPK tablet 12 day dosepack po 48 tablet 0   No current facility-administered medications for this visit.    Allergies  Allergen Reactions  . Codeine Phosphate Itching  . Buprenorphine Hcl Rash and Swelling  . Morphine And Related Swelling and Rash      Discussed warning signs or symptoms. Please see discharge instructions. Patient expresses understanding.

## 2018-02-21 NOTE — Patient Instructions (Signed)
Thank you for coming in today.  Start prednisone tomorrow.  Get shot today.  Start muscle relaxer Take hydrocodone for pain as needed.   If not better next step is physical therapy.   Let me know if not doing well.   Come back or go to the emergency room if you notice new weakness new numbness problems walking or bowel or bladder problems.   Lumbosacral Strain Lumbosacral strain is an injury that causes pain in the lower back (lumbosacral spine). This injury usually occurs from overstretching the muscles or ligaments along your spine. A strain can affect one or more muscles or cord-like tissues that connect bones to other bones (ligaments). What are the causes? This condition may be caused by:  A hard, direct hit (blow) to the back.  Excessive stretching of the lower back muscles. This may result from: ? A fall. ? Lifting something heavy. ? Repetitive movements such as bending or crouching. What increases the risk? The following factors may increase your risk of getting this condition:  Participating in sports or activities that involve: ? A sudden twist of the back. ? Pushing or pulling motions.  Being overweight or obese.  Having poor strength and flexibility, especially tight hamstrings or weak muscles in the back or abdomen.  Having too much of a curve in the lower back.  Having a pelvis that is tilted forward. What are the signs or symptoms? The main symptom of this condition is pain in the lower back, at the site of the strain. Pain may extend (radiate) down one or both legs. How is this diagnosed? This condition is diagnosed based on:  Your symptoms.  Your medical history.  A physical exam. ? Your health care provider may push on certain areas of your back to determine the source of your pain. ? You may be asked to bend forward, backward, and side to side to assess the severity of your pain and your range of motion.  Imaging tests, such  as: ? X-rays. ? MRI.  How is this treated? Treatment for this condition may include:  Putting heat and cold on the affected area.  Medicines to help relieve pain and relax your muscles (muscle relaxants).  NSAIDs to help reduce swelling and discomfort. When your symptoms improve, it is important to gradually return to your normal routine as soon as possible to reduce pain, avoid stiffness, and avoid loss of muscle strength. Generally, symptoms should improve within 6 weeks of treatment. However, recovery time varies. Follow these instructions at home: Managing pain, stiffness, and swelling   If directed, put ice on the injured area during the first 24 hours after your strain. ? Put ice in a plastic bag. ? Place a towel between your skin and the bag. ? Leave the ice on for 20 minutes, 2-3 times a day.  If directed, put heat on the affected area as often as told by your health care provider. Use the heat source that your health care provider recommends, such as a moist heat pack or a heating pad. ? Place a towel between your skin and the heat source. ? Leave the heat on for 20-30 minutes. ? Remove the heat if your skin turns bright red. This is especially important if you are unable to feel pain, heat, or cold. You may have a greater risk of getting burned. Activity  Rest and return to your normal activities as told by your health care provider. Ask your health care provider what activities are  safe for you.  Avoid activities that take a lot of energy for as long as told by your health care provider. General instructions  Take over-the-counter and prescription medicines only as told by your health care provider.  Donot drive or use heavy machinery while taking prescription pain medicine.  Do not use any products that contain nicotine or tobacco, such as cigarettes and e-cigarettes. If you need help quitting, ask your health care provider.  Keep all follow-up visits as told by  your health care provider. This is important. How is this prevented?  Use correct form when playing sports and lifting heavy objects.  Use good posture when sitting and standing.  Maintain a healthy weight.  Sleep on a mattress with medium firmness to support your back.  Be safe and responsible while being active to avoid falls.  Do at least 150 minutes of moderate-intensity exercise each week, such as brisk walking or water aerobics. Try a form of exercise that takes stress off your back, such as swimming or stationary cycling.  Maintain physical fitness, including: ? Strength. ? Flexibility. ? Cardiovascular fitness. ? Endurance. Contact a health care provider if:  Your back pain does not improve after 6 weeks of treatment.  Your symptoms get worse. Get help right away if:  Your back pain is severe.  You cannot stand or walk.  You have difficulty controlling when you urinate or when you have a bowel movement.  You feel nauseous or you vomit.  Your feet get very cold.  You have numbness, tingling, weakness, or problems using your arms or legs.  You develop any of the following: ? Shortness of breath. ? Dizziness. ? Pain in your legs. ? Weakness in your buttocks or legs. ? Discoloration of the skin on your toes or legs. This information is not intended to replace advice given to you by your health care provider. Make sure you discuss any questions you have with your health care provider. Document Released: 10/06/2004 Document Revised: 07/17/2015 Document Reviewed: 05/31/2015 Elsevier Interactive Patient Education  2019 [employer].

## 2018-02-21 NOTE — Addendum Note (Signed)
Addended by: Pixie Casino on: 02/21/2018 04:05 PM   Modules accepted: Orders

## 2018-02-23 ENCOUNTER — Telehealth: Payer: Self-pay

## 2018-02-23 ENCOUNTER — Encounter: Payer: Self-pay | Admitting: Family Medicine

## 2018-02-23 DIAGNOSIS — M5136 Other intervertebral disc degeneration, lumbar region: Secondary | ICD-10-CM

## 2018-02-23 DIAGNOSIS — S39012A Strain of muscle, fascia and tendon of lower back, initial encounter: Secondary | ICD-10-CM

## 2018-02-23 NOTE — Telephone Encounter (Signed)
Sheryl Taylor called and states the low back pain is slightly better. She wanted to know if she should get an x-ray of the lower back. Please advise.

## 2018-02-23 NOTE — Telephone Encounter (Signed)
With give it a week or 2 and if not significantly better get x-ray then.  However if you want an x-ray I think is reasonable to justify 1 now and I am happy to order it.  If patient would like x-ray nurse can order lumbar spine x-ray series

## 2018-02-23 NOTE — Telephone Encounter (Signed)
Patient advised.

## 2018-02-26 ENCOUNTER — Ambulatory Visit (INDEPENDENT_AMBULATORY_CARE_PROVIDER_SITE_OTHER): Payer: BLUE CROSS/BLUE SHIELD

## 2018-02-26 DIAGNOSIS — M48061 Spinal stenosis, lumbar region without neurogenic claudication: Secondary | ICD-10-CM | POA: Diagnosis not present

## 2018-02-26 DIAGNOSIS — M51369 Other intervertebral disc degeneration, lumbar region without mention of lumbar back pain or lower extremity pain: Secondary | ICD-10-CM

## 2018-02-26 DIAGNOSIS — M4807 Spinal stenosis, lumbosacral region: Secondary | ICD-10-CM | POA: Diagnosis not present

## 2018-02-26 DIAGNOSIS — M545 Low back pain: Secondary | ICD-10-CM | POA: Diagnosis not present

## 2018-02-26 DIAGNOSIS — S39012A Strain of muscle, fascia and tendon of lower back, initial encounter: Secondary | ICD-10-CM

## 2018-02-26 DIAGNOSIS — M5136 Other intervertebral disc degeneration, lumbar region: Secondary | ICD-10-CM

## 2018-03-09 DIAGNOSIS — Z1231 Encounter for screening mammogram for malignant neoplasm of breast: Secondary | ICD-10-CM | POA: Diagnosis not present

## 2018-03-09 LAB — HM MAMMOGRAPHY

## 2018-03-13 ENCOUNTER — Telehealth: Payer: Self-pay | Admitting: Family Medicine

## 2018-03-13 NOTE — Telephone Encounter (Signed)
Pt called, states her back pain has returned. I advised based on last OV note that PT would be next step. She reports she's tried that before and it was a waste of money. Will route for other options.

## 2018-03-13 NOTE — Telephone Encounter (Signed)
I am happy to see her again in clinic if she would like.

## 2018-03-13 NOTE — Telephone Encounter (Signed)
Pt advised. She will call back to schedule an appointment.

## 2018-03-19 ENCOUNTER — Other Ambulatory Visit: Payer: Self-pay

## 2018-03-19 MED ORDER — IBUPROFEN 800 MG PO TABS: 800.0000 mg | ORAL_TABLET | Freq: Three times a day (TID) | ORAL | 2 refills | Status: DC | PRN

## 2018-03-19 NOTE — Telephone Encounter (Signed)
Sheryl Taylor called and requested a refill on ibuprofen 800 mg. This is not on current medication list. Please advise.

## 2018-04-12 ENCOUNTER — Encounter: Payer: Self-pay | Admitting: Family Medicine

## 2018-04-12 ENCOUNTER — Telehealth (INDEPENDENT_AMBULATORY_CARE_PROVIDER_SITE_OTHER): Payer: BLUE CROSS/BLUE SHIELD | Admitting: Family Medicine

## 2018-04-12 ENCOUNTER — Other Ambulatory Visit: Payer: Self-pay

## 2018-04-12 VITALS — BP 116/65 | HR 87 | Ht 62.0 in | Wt 160.0 lb

## 2018-04-12 DIAGNOSIS — M5416 Radiculopathy, lumbar region: Secondary | ICD-10-CM

## 2018-04-12 DIAGNOSIS — M5136 Other intervertebral disc degeneration, lumbar region: Secondary | ICD-10-CM

## 2018-04-12 MED ORDER — HYDROCODONE-ACETAMINOPHEN 5-325 MG PO TABS: 1.0000 | ORAL_TABLET | Freq: Four times a day (QID) | ORAL | 0 refills | Status: DC | PRN

## 2018-04-12 MED ORDER — GABAPENTIN 300 MG PO CAPS: ORAL_CAPSULE | ORAL | 3 refills | Status: DC

## 2018-04-12 MED ORDER — METHOCARBAMOL 500 MG PO TABS: 500.0000 mg | ORAL_TABLET | Freq: Three times a day (TID) | ORAL | 0 refills | Status: DC | PRN

## 2018-04-12 NOTE — Patient Instructions (Signed)
Thank you for coming in today. Take the gabapentin for leg nerve pain.  Take the muscle relaxer as needed.  Use norco sparingly for severe pain.  Keep me updated.   Next step is MRI.    Recheck as needed.    Come back or go to the emergency room if you notice new weakness new numbness problems walking or bowel or bladder problems.  Gabapentin capsules or tablets What is this medicine? GABAPENTIN (GA ba pen tin) is used to control seizures in certain types of epilepsy. It is also used to treat certain types of nerve pain. This medicine may be used for other purposes; ask your health care provider or pharmacist if you have questions. COMMON BRAND NAME(S): Active-PAC with Gabapentin, Gabarone, Neurontin What should I tell my health care provider before I take this medicine? They need to know if you have any of these conditions: -kidney disease -suicidal thoughts, plans, or attempt; a previous suicide attempt by you or a family member -an unusual or allergic reaction to gabapentin, other medicines, foods, dyes, or preservatives -pregnant or trying to get pregnant -breast-feeding How should I use this medicine? Take this medicine by mouth with a glass of water. Follow the directions on the prescription label. You can take it with or without food. If it upsets your stomach, take it with food. Take your medicine at regular intervals. Do not take it more often than directed. Do not stop taking except on your doctor's advice. If you are directed to break the 600 or 800 mg tablets in half as part of your dose, the extra half tablet should be used for the next dose. If you have not used the extra half tablet within 28 days, it should be thrown away. A special MedGuide will be given to you by the pharmacist with each prescription and refill. Be sure to read this information carefully each time. Talk to your pediatrician regarding the use of this medicine in children. While this drug may be  prescribed for children as young as 3 years for selected conditions, precautions do apply. Overdosage: If you think you have taken too much of this medicine contact a poison control center or emergency room at once. NOTE: This medicine is only for you. Do not share this medicine with others. What if I miss a dose? If you miss a dose, take it as soon as you can. If it is almost time for your next dose, take only that dose. Do not take double or extra doses. What may interact with this medicine? Do not take this medicine with any of the following medications: -other gabapentin products This medicine may also interact with the following medications: -alcohol -antacids -antihistamines for allergy, cough and cold -certain medicines for anxiety or sleep -certain medicines for depression or psychotic disturbances -homatropine; hydrocodone -naproxen -narcotic medicines (opiates) for pain -phenothiazines like chlorpromazine, mesoridazine, prochlorperazine, thioridazine This list may not describe all possible interactions. Give your health care provider a list of all the medicines, herbs, non-prescription drugs, or dietary supplements you use. Also tell them if you smoke, drink alcohol, or use illegal drugs. Some items may interact with your medicine. What should I watch for while using this medicine? Visit your doctor or health care professional for regular checks on your progress. You may want to keep a record at home of how you feel your condition is responding to treatment. You may want to share this information with your doctor or health care professional at each visit.  You should contact your doctor or health care professional if your seizures get worse or if you have any new types of seizures. Do not stop taking this medicine or any of your seizure medicines unless instructed by your doctor or health care professional. Stopping your medicine suddenly can increase your seizures or their  severity. Wear a medical identification bracelet or chain if you are taking this medicine for seizures, and carry a card that lists all your medications. You may get drowsy, dizzy, or have blurred vision. Do not drive, use machinery, or do anything that needs mental alertness until you know how this medicine affects you. To reduce dizzy or fainting spells, do not sit or stand up quickly, especially if you are an older patient. Alcohol can increase drowsiness and dizziness. Avoid alcoholic drinks. Your mouth may get dry. Chewing sugarless gum or sucking hard candy, and drinking plenty of water will help. The use of this medicine may increase the chance of suicidal thoughts or actions. Pay special attention to how you are responding while on this medicine. Any worsening of mood, or thoughts of suicide or dying should be reported to your health care professional right away. Women who become pregnant while using this medicine may enroll in the [place] Antiepileptic Drug Pregnancy Registry by calling [phone]. This registry collects information about the safety of antiepileptic drug use during pregnancy. What side effects may I notice from receiving this medicine? Side effects that you should report to your doctor or health care professional as soon as possible: -allergic reactions like skin rash, itching or hives, swelling of the face, lips, or tongue -worsening of mood, thoughts or actions of suicide or dying Side effects that usually do not require medical attention (report to your doctor or health care professional if they continue or are bothersome): -constipation -difficulty walking or controlling muscle movements -dizziness -nausea -slurred speech -tiredness -tremors -weight gain This list may not describe all possible side effects. Call your doctor for medical advice about side effects. You may report side effects to FDA at 1-800-FDA-1088. Where should I keep my medicine? Keep  out of reach of children. This medicine may cause accidental overdose and death if it taken by other adults, children, or pets. Mix any unused medicine with a substance like cat litter or coffee grounds. Then throw the medicine away in a sealed container like a sealed bag or a coffee can with a lid. Do not use the medicine after the expiration date. Store at room temperature between 15 and 30 degrees C (59 and 86 degrees F). NOTE: This sheet is a summary. It may not cover all possible information. If you have questions about this medicine, talk to your doctor, pharmacist, or health care provider.  2019 Elsevier/Gold Standard (2017-06-01 13:21:44)

## 2018-04-12 NOTE — Progress Notes (Addendum)
Virtual Visit  via Video Note  I connected with      Sheryl Taylor  by a video enabled telemedicine application and verified that I am speaking with the correct person using two identifiers.   I discussed the limitations of evaluation and management by telemedicine and the availability of in person appointments. The patient expressed understanding and agreed to proceed.  History of Present Illness: Sheryl Taylor is a 48 y.o. female who would like to discuss back pain.  Sheryl Taylor was seen in mid February for acute episode of low back pain thought to be myofascial strain.  She typically when she has these episodes will get better in a week or 2 however she is continued to have pain.  She notes pain improved initially but is still present moderately.  She rates her pain as about a 7 out of 10.  This is fortunately down from her self-reported pain scale at its worse of 100 out of 10.  She has taken some leftover methocarbamol and hydrocodone for pain which has helped.  She is used ibuprofen which helps a little.  She notes pain radiating down her left leg to the lateral calf and dorsal foot.  She denies any significant weakness numbness bowel bladder dysfunction fevers or chills.  She feels well otherwise.   She notes that she is not sure that she could afford an MRI right now. Observations/Objective: BP 116/65   Pulse 87   Ht 5\' 2"  (1.575 m)   Wt 160 lb (72.6 kg)   SpO2 97%   BMI 29.26 kg/m  Wt Readings from Last 5 Encounters:  04/12/18 160 lb (72.6 kg)  11/17/17 161 lb (73 kg)  10/26/17 161 lb (73 kg)  08/04/17 155 lb (70.3 kg)  05/09/17 153 lb 3.2 oz (69.5 kg)   Exam: Appearance nontoxic appearing no acute distress. Normal Speech.   Normal trunk motion.  Normal gait.  Lab and Radiology Results Dg Lumbar Spine Complete  Result Date: 02/27/2018 CLINICAL DATA:  48 year old female with back pain off and on for years. Subsequent encounter. EXAM: LUMBAR SPINE - COMPLETE 4+ VIEW  COMPARISON:  02/06/2017. FINDINGS: Slight straightening lumbar spine. No compression fracture or pars defect. Mild narrowing posterior aspect L4-5 and L5-S1 disc space. Acute angulation upper coccyx unchanged and may be congenital. Prior cholecystectomy. IMPRESSION: Mild narrowing posterior aspect L4-5 and L5-S1 disc space. Electronically Signed   By: Lacy Duverney M.D.   On: 02/27/2018 09:44   I personally (independently) visualized and performed the interpretation of the images attached in this note.    Assessment and Plan: 48 y.o. female with lumbar pain with left radiculopathy likely L5 nerve root.  Patient does have degenerative changes seen on x-ray in February at this level.  Plan for gabapentin and refill Robaxin.  Limited hydrocodone for pain control.  If not better next up would be MRI.  However patient is reluctant to proceed with MRI due to COVID-19 and cost.  Patient will keep me updated via my chart.  PDMP reviewed during this encounter. No orders of the defined types were placed in this encounter.  Meds ordered this encounter  Medications  . gabapentin (NEURONTIN) 300 MG capsule    Sig: One tab PO qHS for a week, then BID for a week, then TID. May double weekly to a max of 3,600mg /day    Dispense:  180 capsule    Refill:  3  . HYDROcodone-acetaminophen (NORCO/VICODIN) 5-325 MG tablet    Sig:  Take 1 tablet by mouth every 6 (six) hours as needed.    Dispense:  15 tablet    Refill:  0  . methocarbamol (ROBAXIN) 500 MG tablet    Sig: Take 1 tablet (500 mg total) by mouth every 8 (eight) hours as needed for muscle spasms.    Dispense:  30 tablet    Refill:  0    Follow Up Instructions:    I discussed the assessment and treatment plan with the patient. The patient was provided an opportunity to ask questions and all were answered. The patient agreed with the plan and demonstrated an understanding of the instructions.   The patient was advised to call back or seek an  in-person evaluation if the symptoms worsen or if the condition fails to improve as anticipated.  I provided 25 minutes of non-face-to-face time during this encounter.    Historical information moved to improve visibility of documentation.  Past Medical History:  Diagnosis Date  . Asthma   . Bronchitis   . Hyperlipidemia   . Thyroid nodule    Past Surgical History:  Procedure Laterality Date  . CESAREAN SECTION     x 2   . CHOLECYSTECTOMY  2003   Social History   Tobacco Use  . Smoking status: Never Smoker  . Smokeless tobacco: Never Used  Substance Use Topics  . Alcohol use: No    Alcohol/week: 0.0 standard drinks   family history includes Breast cancer in her maternal aunt, unknown relative, and unknown relative; CAD in her father; Heart attack in her paternal uncle; Heart failure in her father; Hyperlipidemia in her father; Lung cancer in her father.  Medications: Current Outpatient Medications  Medication Sig Dispense Refill  . fluticasone (FLONASE) 50 MCG/ACT nasal spray Place into the nose.    [name] HYDROcodone-acetaminophen (NORCO/VICODIN) 5-325 MG tablet Take 1 tablet by mouth every 6 (six) hours as needed. 15 tablet 0  . ibuprofen (ADVIL,MOTRIN) 800 MG tablet Take 1 tablet (800 mg total) by mouth every 8 (eight) hours as needed. 30 tablet 2  . methocarbamol (ROBAXIN) 500 MG tablet Take 1 tablet (500 mg total) by mouth every 8 (eight) hours as needed for muscle spasms. 30 tablet 0  . mupirocin ointment (BACTROBAN) 2 % Apply to inside of each nares daily for 10 days then twice a week for maintenance. 30 g 0  . pramoxine (PROCTOFOAM) 1 % foam APPLY 1 APPLICATION TOPICALLY 4 (FOUR) TIMES DAILY AS NEEDED. 120 g 0  . predniSONE (STERAPRED UNI-PAK 48 TAB) 5 MG (48) TBPK tablet 12 day dosepack po 48 tablet 0  . triamcinolone ointment (KENALOG) 0.5 % Apply 1 application topically at bedtime. 15 g 0  . valACYclovir (VALTREX) 1000 MG tablet Take 1 tablet (1,000 mg total) by mouth  3 (three) times daily. 21 tablet 0  . gabapentin (NEURONTIN) 300 MG capsule One tab PO qHS for a week, then BID for a week, then TID. May double weekly to a max of 3,600mg /day 180 capsule 3   No current facility-administered medications for this visit.    Allergies  Allergen Reactions  . Codeine Phosphate Itching  . Buprenorphine Hcl Rash and Swelling  . Morphine And Related Swelling and Rash   Addendum to correct incorrect date due to templating error

## 2018-04-23 ENCOUNTER — Encounter: Payer: Self-pay | Admitting: Family Medicine

## 2018-05-04 ENCOUNTER — Other Ambulatory Visit: Payer: Self-pay | Admitting: Family Medicine

## 2018-05-04 NOTE — Telephone Encounter (Signed)
Dr. Denyse Amass,  You seen this pt via video visit on 04/12/2018 she is requesting a 90 day supply of this medication. Please advise.Laureen Ochs, Viann Shove, CMA

## 2018-05-23 DIAGNOSIS — Z0000 Encounter for general adult medical examination without abnormal findings: Secondary | ICD-10-CM | POA: Diagnosis not present

## 2018-05-23 DIAGNOSIS — Z6831 Body mass index (BMI) 31.0-31.9, adult: Secondary | ICD-10-CM | POA: Diagnosis not present

## 2018-05-23 DIAGNOSIS — Z01419 Encounter for gynecological examination (general) (routine) without abnormal findings: Secondary | ICD-10-CM | POA: Diagnosis not present

## 2018-08-07 ENCOUNTER — Encounter: Payer: BLUE CROSS/BLUE SHIELD | Admitting: Family Medicine

## 2018-08-13 ENCOUNTER — Encounter: Payer: Self-pay | Admitting: Physician Assistant

## 2018-08-13 ENCOUNTER — Ambulatory Visit (INDEPENDENT_AMBULATORY_CARE_PROVIDER_SITE_OTHER): Payer: BC Managed Care – PPO | Admitting: Physician Assistant

## 2018-08-13 DIAGNOSIS — L259 Unspecified contact dermatitis, unspecified cause: Secondary | ICD-10-CM

## 2018-08-13 MED ORDER — PREDNISONE 20 MG PO TABS: ORAL_TABLET | ORAL | 0 refills | Status: DC

## 2018-08-13 MED ORDER — HYDROXYZINE HCL 50 MG PO TABS: 50.0000 mg | ORAL_TABLET | Freq: Four times a day (QID) | ORAL | 0 refills | Status: DC | PRN

## 2018-08-13 MED ORDER — TRIAMCINOLONE ACETONIDE 0.1 % EX CREA: 1.0000 "application " | TOPICAL_CREAM | Freq: Two times a day (BID) | CUTANEOUS | 0 refills | Status: DC

## 2018-08-13 NOTE — Progress Notes (Signed)
..  Virtual Visit via Video Note  I connected with [name] on 08/14/18 at 10:30 AM EDT by a video enabled telemedicine application and verified that I am speaking with the correct person using two identifiers.  Location: Patient: home Provider: clinic   I discussed the limitations of evaluation and management by telemedicine and the availability of in person appointments. The patient expressed understanding and agreed to proceed.  History of Present Illness: Pt is a 48 yo female who calls into the clinic with rash that woke her up this morning at 2am itching. She is concerned because her husband and daughter both are positive covid. She has no covid symptoms except now this rash. She denies any new medications, detergents, lotions, foods. She did drink a lot of "red drinks yesterday". She has seasonal allergies but no known allergies. She admits to being in the sun a lot and not wearing sunscreen. She took benadryl this morning but did not really help. No fever, chills, SOB, Cough, GI symptoms.   .. Active Ambulatory Problems    Diagnosis Date Noted  . Tinnitus 06/05/2014  . Anemia, iron deficiency 06/05/2014  . Insomnia 06/05/2014  . Lumbar degenerative disc disease 09/26/2014  . Strain of left hip adductor muscle 09/26/2014  . Plantar fasciitis, right 07/29/2016   Resolved Ambulatory Problems    Diagnosis Date Noted  . Sinusitis 10/31/2014   Past Medical History:  Diagnosis Date  . Asthma   . Bronchitis   . Hyperlipidemia   . Thyroid nodule    Reviewed med, allergy, problem list.    Observations/Objective: No acute distress.  Normal breathing.  Very fine flesh colored papules on a erythematous base on chest, neck, arms.   [name].There were no vitals filed for this visit. There is no height or weight on file to calculate BMI.     Assessment and Plan: Marland KitchenMarland KitchenVickie was seen today for rash.  Diagnoses and all orders for this visit:  Contact dermatitis, unspecified contact  dermatitis type, unspecified trigger -     hydrOXYzine (ATARAX/VISTARIL) 50 MG tablet; Take 1 tablet (50 mg total) by mouth every 6 (six) hours as needed for itching. For itching. -     triamcinolone cream (KENALOG) 0.1 %; Apply 1 application topically 2 (two) times daily. -     predniSONE (DELTASONE) 20 MG tablet; Take one tablet twice daily for 5 days.   I suspect some sun dermatitis. Prednisone burst, topical cream and hydroxyzine given for itching and rash.  Continue to self isolate with family but she should try to isolate herself from positive patients in home.  Rest and hydrate. Follow up if new or worsening symptoms.    Follow Up Instructions:    I discussed the assessment and treatment plan with the patient. The patient was provided an opportunity to ask questions and all were answered. The patient agreed with the plan and demonstrated an understanding of the instructions.   The patient was advised to call back or seek an in-person evaluation if the symptoms worsen or if the condition fails to improve as anticipated.    Iran Planas, PA-C

## 2018-08-16 ENCOUNTER — Encounter: Payer: Self-pay | Admitting: Family Medicine

## 2018-09-21 ENCOUNTER — Ambulatory Visit (INDEPENDENT_AMBULATORY_CARE_PROVIDER_SITE_OTHER): Payer: BC Managed Care – PPO | Admitting: Physician Assistant

## 2018-09-21 ENCOUNTER — Other Ambulatory Visit: Payer: Self-pay

## 2018-09-21 ENCOUNTER — Encounter: Payer: Self-pay | Admitting: Physician Assistant

## 2018-09-21 VITALS — BP 113/69 | HR 88 | Temp 98.0°F | Wt 171.0 lb

## 2018-09-21 DIAGNOSIS — N3000 Acute cystitis without hematuria: Secondary | ICD-10-CM | POA: Diagnosis not present

## 2018-09-21 DIAGNOSIS — R3915 Urgency of urination: Secondary | ICD-10-CM | POA: Diagnosis not present

## 2018-09-21 LAB — POCT URINALYSIS DIPSTICK
Bilirubin, UA: NEGATIVE
Blood, UA: NEGATIVE
Glucose, UA: NEGATIVE
Ketones, UA: NEGATIVE
Nitrite, UA: NEGATIVE
Protein, UA: NEGATIVE
Spec Grav, UA: 1.01 (ref 1.010–1.025)
Urobilinogen, UA: 0.2 E.U./dL
pH, UA: 5.5 (ref 5.0–8.0)

## 2018-09-21 MED ORDER — NITROFURANTOIN MONOHYD MACRO 100 MG PO CAPS: 100.0000 mg | ORAL_CAPSULE | Freq: Two times a day (BID) | ORAL | 0 refills | Status: DC

## 2018-09-21 MED ORDER — PHENAZOPYRIDINE HCL 200 MG PO TABS: 200.0000 mg | ORAL_TABLET | Freq: Three times a day (TID) | ORAL | 0 refills | Status: AC

## 2018-09-21 MED ORDER — FLUCONAZOLE 150 MG PO TABS: 150.0000 mg | ORAL_TABLET | Freq: Once | ORAL | 0 refills | Status: AC

## 2018-09-21 NOTE — Progress Notes (Signed)
Subjective:    Patient ID: Sheryl Taylor, female    DOB: 06/27/1970, 48 y.o.   MRN: [id number]  HPI  Patient is a 48 year old female who presents to the clinic with dysuria, suprapubic and left lower abdominal pain, increasing urinary frequency, increase in urinary urgency since Sunday.  She has tried water and cranberry juice.  That has not provided any benefit.  She is also been taking ibuprofen.  That does help some with her pain.  [facility] and [facility].  She left the bathroom and almost immediately had to urinate again and could not hold her urine and had an accident in [place].  She denies any fever, chills, nausea, vomiting.  She describes her lower abdominal pain as aching.  She has had a urinary tract infection before but not recently.  She has never had a kidney stone.  .. Active Ambulatory Problems    Diagnosis Date Noted  . Tinnitus 06/05/2014  . Anemia, iron deficiency 06/05/2014  . Insomnia 06/05/2014  . Lumbar degenerative disc disease 09/26/2014  . Strain of left hip adductor muscle 09/26/2014  . Plantar fasciitis, right 07/29/2016   Resolved Ambulatory Problems    Diagnosis Date Noted  . Sinusitis 10/31/2014   Past Medical History:  Diagnosis Date  . Asthma   . Bronchitis   . Hyperlipidemia   . Thyroid nodule     Review of Systems See hpi    Objective:   Physical Exam Vitals signs reviewed.  Constitutional:      Appearance: Normal appearance.  HENT:     Head: Normocephalic and atraumatic.  Cardiovascular:     Rate and Rhythm: Normal rate and regular rhythm.  Pulmonary:     Effort: Pulmonary effort is normal.     Breath sounds: Normal breath sounds.  Abdominal:     General: Bowel sounds are normal. There is no distension.     Palpations: Abdomen is soft. There is no mass.     Tenderness: There is abdominal tenderness. There is no right CVA tenderness, left CVA tenderness, guarding or rebound.     Hernia: No hernia is present.      Comments: Tenderness over suprapubic area and left lower quadrant.   Neurological:     General: No focal deficit present.     Mental Status: She is alert and oriented to person, place, and time.  Psychiatric:        Mood and Affect: Mood normal.        Behavior: Behavior normal.           Assessment & Plan:  [name].[name].[name] was seen today for urinary tract infection.  Diagnoses and all orders for this visit:  Acute cystitis without hematuria -     nitrofurantoin, macrocrystal-monohydrate, (MACROBID) 100 MG capsule; Take 1 capsule (100 mg total) by mouth 2 (two) times daily. -     phenazopyridine (PYRIDIUM) 200 MG tablet; Take 1 tablet (200 mg total) by mouth 3 (three) times daily for 2 days. -     Urine Culture  Urgency of urination -     POCT urinalysis dipstick -     nitrofurantoin, macrocrystal-monohydrate, (MACROBID) 100 MG capsule; Take 1 capsule (100 mg total) by mouth 2 (two) times daily. -     phenazopyridine (PYRIDIUM) 200 MG tablet; Take 1 tablet (200 mg total) by mouth 3 (three) times daily for 2 days. -     Urine Culture  Other orders -  fluconazole (DIFLUCAN) 150 MG tablet; Take 1 tablet (150 mg total) by mouth once for 1 dose.    Results for orders placed or performed in visit on 09/21/18  POCT urinalysis dipstick  Result Value Ref Range   Color, UA light yellow    Clarity, UA slightly cloudy    Glucose, UA Negative Negative   Bilirubin, UA neg    Ketones, UA neg    Spec Grav, UA 1.010 1.010 - 1.025   Blood, UA neg    pH, UA 5.5 5.0 - 8.0   Protein, UA Negative Negative   Urobilinogen, UA 0.2 0.2 or 1.0 E.U./dL   Nitrite, UA neg    Leukocytes, UA Large (3+) (A) Negative   Appearance     Odor     UA dipstick was positive for leukocytes and negative for blood and nitrite.  Will culture.  We will go ahead and treat empirically for acute cystitis with Macrobid for 7 days and Pyridium for 2 days.  Discussed other symptomatic care such as increasing hydration  and ibuprofen.  I do not think this represents a kidney stone due to her pain level and no blood in urine dipstick.  Certainly if symptoms worsen or do not improve please follow-up.  Patient does have a history of yeast infection after antibiotic use.  Diflucan was sent to the pharmacy.

## 2018-09-21 NOTE — Patient Instructions (Signed)
Urinary Tract Infection, Adult A urinary tract infection (UTI) is an infection of any part of the urinary tract. The urinary tract includes:  The kidneys.  The ureters.  The bladder.  The urethra. These organs make, store, and get rid of pee (urine) in the body. What are the causes? This is caused by germs (bacteria) in your genital area. These germs grow and cause swelling (inflammation) of your urinary tract. What increases the risk? You are more likely to develop this condition if:  You have a small, thin tube (catheter) to drain pee.  You cannot control when you pee or poop (incontinence).  You are female, and: ? You use these methods to prevent pregnancy: ? A medicine that kills sperm (spermicide). ? A device that blocks sperm (diaphragm). ? You have low levels of a female hormone (estrogen). ? You are pregnant.  You have genes that add to your risk.  You are sexually active.  You take antibiotic medicines.  You have trouble peeing because of: ? A prostate that is bigger than normal, if you are female. ? A blockage in the part of your body that drains pee from the bladder (urethra). ? A kidney stone. ? A nerve condition that affects your bladder (neurogenic bladder). ? Not getting enough to drink. ? Not peeing often enough.  You have other conditions, such as: ? Diabetes. ? A weak disease-fighting system (immune system). ? Sickle cell disease. ? Gout. ? Injury of the spine. What are the signs or symptoms? Symptoms of this condition include:  Needing to pee right away (urgently).  Peeing often.  Peeing small amounts often.  Pain or burning when peeing.  Blood in the pee.  Pee that smells bad or not like normal.  Trouble peeing.  Pee that is cloudy.  Fluid coming from the vagina, if you are female.  Pain in the belly or lower back. Other symptoms include:  Throwing up (vomiting).  No urge to eat.  Feeling mixed up (confused).  Being tired  and grouchy (irritable).  A fever.  Watery poop (diarrhea). How is this treated? This condition may be treated with:  Antibiotic medicine.  Other medicines.  Drinking enough water. Follow these instructions at home:  Medicines  Take over-the-counter and prescription medicines only as told by your doctor.  If you were prescribed an antibiotic medicine, take it as told by your doctor. Do not stop taking it even if you start to feel better. General instructions  Make sure you: ? Pee until your bladder is empty. ? Do not hold pee for a long time. ? Empty your bladder after sex. ? Wipe from front to back after pooping if you are a female. Use each tissue one time when you wipe.  Drink enough fluid to keep your pee pale yellow.  Keep all follow-up visits as told by your doctor. This is important. Contact a doctor if:  You do not get better after 1-2 days.  Your symptoms go away and then come back. Get help right away if:  You have very bad back pain.  You have very bad pain in your lower belly.  You have a fever.  You are sick to your stomach (nauseous).  You are throwing up. Summary  A urinary tract infection (UTI) is an infection of any part of the urinary tract.  This condition is caused by germs in your genital area.  There are many risk factors for a UTI. These include having a small, thin   tube to drain pee and not being able to control when you pee or poop.  Treatment includes antibiotic medicines for germs.  Drink enough fluid to keep your pee pale yellow. This information is not intended to replace advice given to you by your health care provider. Make sure you discuss any questions you have with your health care provider. Document Released: 06/15/2007 Document Revised: 12/14/2017 Document Reviewed: 07/06/2017 Elsevier Patient Education  2020 [employer]  

## 2018-09-24 LAB — URINE CULTURE
MICRO NUMBER:: 872134
SPECIMEN QUALITY:: ADEQUATE

## 2018-09-24 NOTE — Progress Notes (Signed)
E.coli found on culture. Should be feeling better as sensitive to macrobid. Follow up as needed.

## 2018-10-15 ENCOUNTER — Other Ambulatory Visit: Payer: Self-pay

## 2018-10-15 ENCOUNTER — Ambulatory Visit (INDEPENDENT_AMBULATORY_CARE_PROVIDER_SITE_OTHER): Payer: BC Managed Care – PPO | Admitting: Family Medicine

## 2018-10-15 ENCOUNTER — Encounter: Payer: Self-pay | Admitting: Family Medicine

## 2018-10-15 VITALS — BP 97/55 | HR 76 | Ht 62.0 in | Wt 170.0 lb

## 2018-10-15 DIAGNOSIS — N926 Irregular menstruation, unspecified: Secondary | ICD-10-CM | POA: Insufficient documentation

## 2018-10-15 DIAGNOSIS — R2232 Localized swelling, mass and lump, left upper limb: Secondary | ICD-10-CM

## 2018-10-15 DIAGNOSIS — M5410 Radiculopathy, site unspecified: Secondary | ICD-10-CM | POA: Insufficient documentation

## 2018-10-15 DIAGNOSIS — R4589 Other symptoms and signs involving emotional state: Secondary | ICD-10-CM | POA: Insufficient documentation

## 2018-10-15 LAB — CBC WITH DIFFERENTIAL/PLATELET
Absolute Monocytes: 561 cells/uL (ref 200–950)
Basophils Absolute: 33 cells/uL (ref 0–200)
Basophils Relative: 0.5 %
Eosinophils Absolute: 73 cells/uL (ref 15–500)
Eosinophils Relative: 1.1 %
HCT: 38 % (ref 35.0–45.0)
Hemoglobin: 12.2 g/dL (ref 11.7–15.5)
Lymphs Abs: 2719 cells/uL (ref 850–3900)
MCH: 26.8 pg — ABNORMAL LOW (ref 27.0–33.0)
MCHC: 32.1 g/dL (ref 32.0–36.0)
MCV: 83.5 fL (ref 80.0–100.0)
MPV: 11 fL (ref 7.5–12.5)
Monocytes Relative: 8.5 %
Neutro Abs: 3214 cells/uL (ref 1500–7800)
Neutrophils Relative %: 48.7 %
Platelets: 371 10*3/uL (ref 140–400)
RBC: 4.55 10*6/uL (ref 3.80–5.10)
RDW: 14.4 % (ref 11.0–15.0)
Total Lymphocyte: 41.2 %
WBC: 6.6 10*3/uL (ref 3.8–10.8)

## 2018-10-15 NOTE — Progress Notes (Signed)
Acute Office Visit  Subjective:    Patient ID: Sheryl Taylor, female    DOB: 06/05/1970, 48 y.o.   MRN: [id number]  Chief Complaint  Patient presents with  . Arm Pain    HPI Patient is in today for lump on her left forearm.  She says she noticed it Saturday.  She says it has been sore to touch. She hasn't tried heat or ice.  Doesn't notice it unless she touches it.  No injury or trauma.  No drainage or wound. No other lesion on her body.  No fever, or chills or sweats.  She denies any bug bumps.  No swollen LNs.  She denies any other lesions on her body.  Past Medical History:  Diagnosis Date  . Asthma   . Bronchitis   . Hyperlipidemia   . Thyroid nodule     Past Surgical History:  Procedure Laterality Date  . CESAREAN SECTION     x 2   . CHOLECYSTECTOMY  2003    Family History  Problem Relation Age of Onset  . Lung cancer Father        lung (50)  . Heart failure Father        (41)  . Hyperlipidemia Father   . CAD Father   . Breast cancer Unknown        great aunt  . Breast cancer Unknown        cousin  . Heart attack Paternal Uncle        bypass surgery   . Breast cancer Maternal Aunt     Social History   Socioeconomic History  . Marital status: Married    Spouse name: Karleen HampshireSpencer   . Number of children: 2  . Years of education: Not on file  . Highest education level: Not on file  Occupational History  . Occupation: Geologist, engineeringteacher assistant    Comment: [employer]  Social Needs  . Financial resource strain: Not on file  . Food insecurity    Worry: Not on file    Inability: Not on file  . Transportation needs    Medical: Not on file    Non-medical: Not on file  Tobacco Use  . Smoking status: Never Smoker  . Smokeless tobacco: Never Used  Substance and Sexual Activity  . Alcohol use: No    Alcohol/week: 0.0 standard drinks  . Drug use: No  . Sexual activity: Yes    Partners: Male  Lifestyle  . Physical activity    Days per week: Not on file    Minutes per  session: Not on file  . Stress: Not on file  Relationships  . Social Musicianconnections    Talks on phone: Not on file    Gets together: Not on file    Attends religious service: Not on file    Active member of club or organization: Not on file    Attends meetings of clubs or organizations: Not on file    Relationship status: Not on file  . Intimate partner violence    Fear of current or ex partner: Not on file    Emotionally abused: Not on file    Physically abused: Not on file    Forced sexual activity: Not on file  Other Topics Concern  . Not on file  Social History Narrative   Patient works as a Research officer, trade unionTeachers Assistant with [employer] she is married to [place]. And has two children. She exercises 2 days a wk for an 1 hour.  Her sister died suddenty at 78 unknown cause and this makes her very anxious.      Outpatient Medications Prior to Visit  Medication Sig Dispense Refill  . cetirizine (ZYRTEC) 10 MG tablet Take 10 mg by mouth daily.    . fluticasone (FLONASE) 50 MCG/ACT nasal spray Place into the nose.    . ibuprofen (ADVIL,MOTRIN) 800 MG tablet Take 1 tablet (800 mg total) by mouth every 8 (eight) hours as needed. 30 tablet 2  . hydrOXYzine (ATARAX/VISTARIL) 50 MG tablet Take 1 tablet (50 mg total) by mouth every 6 (six) hours as needed for itching. For itching. 30 tablet 0  . nitrofurantoin, macrocrystal-monohydrate, (MACROBID) 100 MG capsule Take 1 capsule (100 mg total) by mouth 2 (two) times daily. 14 capsule 0  . triamcinolone cream (KENALOG) 0.1 % Apply 1 application topically 2 (two) times daily. 60 g 0   No facility-administered medications prior to visit.     Allergies  Allergen Reactions  . Codeine Phosphate Itching  . Buprenorphine Hcl Rash and Swelling  . Morphine And Related Swelling and Rash    ROS     Objective:    Physical Exam  Constitutional: She is oriented to person, place, and time. She appears well-developed and well-nourished.  HENT:  Head: Normocephalic  and atraumatic.  Eyes: Conjunctivae and EOM are normal.  Cardiovascular: Normal rate.  Pulmonary/Chest: Effort normal.  Lymphadenopathy:  No lymphadenopathy in the left axilla.  Neurological: She is alert and oriented to person, place, and time.  Skin: Skin is dry. No pallor.  There is a 2.5 x 2.5 cm skin colored papular perfectly round lesion on her posterior left forearm.  No erythema or increased warmth.  Very tender to touch.  No enlarged pore.  Psychiatric: She has a normal mood and affect. Her behavior is normal.  Vitals reviewed.   BP (!) 97/55   Pulse 76   Ht 5\' 2"  (1.575 m)   Wt 170 lb (77.1 kg)   SpO2 100%   BMI 31.09 kg/m  Wt Readings from Last 3 Encounters:  10/15/18 170 lb (77.1 kg)  09/21/18 171 lb (77.6 kg)  04/12/18 160 lb (72.6 kg)    There are no preventive care reminders to display for this patient.  There are no preventive care reminders to display for this patient.   Lab Results  Component Value Date   TSH 2.21 08/02/2017   Lab Results  Component Value Date   WBC 4.7 08/02/2017   HGB 12.5 08/02/2017   HCT 37.9 08/02/2017   MCV 82.0 08/02/2017   PLT 288 08/02/2017   Lab Results  Component Value Date   NA 140 08/02/2017   K 4.8 08/02/2017   CO2 29 08/02/2017   GLUCOSE 95 08/02/2017   BUN 18 08/02/2017   CREATININE 0.98 08/02/2017   BILITOT 0.5 08/02/2017   ALKPHOS 79 07/29/2016   AST 19 08/02/2017   ALT 19 08/02/2017   PROT 6.7 08/02/2017   ALBUMIN 4.0 07/29/2016   CALCIUM 9.1 08/02/2017   Lab Results  Component Value Date   CHOL 191 08/02/2017   Lab Results  Component Value Date   HDL 55 08/02/2017   Lab Results  Component Value Date   LDLCALC 121 (H) 08/02/2017   Lab Results  Component Value Date   TRIG 54 08/02/2017   Lab Results  Component Value Date   CHOLHDL 3.5 08/02/2017   No results found for: HGBA1C     Assessment & Plan:  Problem List Items Addressed This Visit    None    Visit Diagnoses    Mass of  left upper extremity    -  Primary   Relevant Orders   US SOFT TISSUE UPPER EXTREMITY LIMITED LEFT (NON-VASCULAR)   CBC with Differential/Platelet     Left forearm mass-unclear etiology.  Possible epidermal cyst though I do not see enlarged pore.  Consider lipoma though she is very tender on exam and only noticed it a couple of days ago.  It does not have any significant erythema or induration to suggest abscess but will try to go ahead and check a CBC with differential today.  We will get her scheduled for ultrasound for further work-up.  Call if any new symptoms or if it changes or gets worse.   No orders of the defined types were placed in this encounter.    Nani Gasser, MD

## 2018-10-16 ENCOUNTER — Ambulatory Visit (INDEPENDENT_AMBULATORY_CARE_PROVIDER_SITE_OTHER): Payer: BC Managed Care – PPO

## 2018-10-16 DIAGNOSIS — R2232 Localized swelling, mass and lump, left upper limb: Secondary | ICD-10-CM

## 2018-10-16 DIAGNOSIS — R600 Localized edema: Secondary | ICD-10-CM | POA: Diagnosis not present

## 2018-10-16 NOTE — Progress Notes (Signed)
All labs are normal. 

## 2018-10-17 ENCOUNTER — Encounter: Payer: Self-pay | Admitting: Family Medicine

## 2018-10-17 MED ORDER — AMOXICILLIN-POT CLAVULANATE 875-125 MG PO TABS: 1.0000 | ORAL_TABLET | Freq: Two times a day (BID) | ORAL | 0 refills | Status: DC

## 2018-10-17 NOTE — Telephone Encounter (Addendum)
Called Pt. Advised of Providers recommendation. Scheduled for next week.

## 2018-10-19 ENCOUNTER — Encounter: Payer: Self-pay | Admitting: Family Medicine

## 2018-10-26 ENCOUNTER — Other Ambulatory Visit: Payer: Self-pay

## 2018-10-26 ENCOUNTER — Encounter: Payer: Self-pay | Admitting: Family Medicine

## 2018-10-26 ENCOUNTER — Ambulatory Visit (INDEPENDENT_AMBULATORY_CARE_PROVIDER_SITE_OTHER): Payer: BC Managed Care – PPO | Admitting: Family Medicine

## 2018-10-26 VITALS — BP 109/60 | HR 81 | Ht 62.0 in | Wt 171.0 lb

## 2018-10-26 DIAGNOSIS — N760 Acute vaginitis: Secondary | ICD-10-CM | POA: Diagnosis not present

## 2018-10-26 DIAGNOSIS — M25511 Pain in right shoulder: Secondary | ICD-10-CM

## 2018-10-26 DIAGNOSIS — R2232 Localized swelling, mass and lump, left upper limb: Secondary | ICD-10-CM | POA: Diagnosis not present

## 2018-10-26 MED ORDER — FLUCONAZOLE 150 MG PO TABS: 150.0000 mg | ORAL_TABLET | Freq: Every day | ORAL | 1 refills | Status: DC

## 2018-10-26 NOTE — Progress Notes (Signed)
Established Patient Office Visit  Subjective:  Patient ID: Sheryl Taylor, female    DOB: 24-Mar-1970  Age: 48 y.o. MRN: [id number]  CC:  Chief Complaint  Patient presents with  . Cyst    pt has finished the ABX and thought that the cyst would go away and it hasn't.she stated that that area is still sore but it has decreased in size.  . Vaginitis    she reports that she developed a yeast infection after being on the ABX    HPI Sheryl Taylor presents for f/u lesion on left arm. Pt has finished the ABX and thought that the cyst would go away and it hasn't.she stated that that area is still sore but it has decreased in size.  C/o vaginitis - she reports that she developed a yeast infection after being on the ABX.she says it feels itchy and irritated.   She has a right shoulder pain as well.  Painful to life her arm. Has been bothering her for a few weeks but became more persistent in teh last couple of week.  She says it painful to sleep on that shoulder she has been doing refit for exercise and the last time she went a week ago it was really painful to push through a lot of the motions but she tried to.  Past Medical History:  Diagnosis Date  . Asthma   . Bronchitis   . Hyperlipidemia   . Thyroid nodule     Past Surgical History:  Procedure Laterality Date  . CESAREAN SECTION     x 2   . CHOLECYSTECTOMY  2003    Family History  Problem Relation Age of Onset  . Lung cancer Father        lung (50)  . Heart failure Father        (41)  . Hyperlipidemia Father   . CAD Father   . Breast cancer Unknown        great aunt  . Breast cancer Unknown        cousin  . Heart attack Paternal Uncle        bypass surgery   . Breast cancer Maternal Aunt     Social History   Socioeconomic History  . Marital status: Married    Spouse name: Karleen Hampshire   . Number of children: 2  . Years of education: Not on file  . Highest education level: Not on file  Occupational History  . Occupation:  Geologist, engineering    Comment: [employer]  Social Needs  . Financial resource strain: Not on file  . Food insecurity    Worry: Not on file    Inability: Not on file  . Transportation needs    Medical: Not on file    Non-medical: Not on file  Tobacco Use  . Smoking status: Never Smoker  . Smokeless tobacco: Never Used  Substance and Sexual Activity  . Alcohol use: No    Alcohol/week: 0.0 standard drinks  . Drug use: No  . Sexual activity: Yes    Partners: Male  Lifestyle  . Physical activity    Days per week: Not on file    Minutes per session: Not on file  . Stress: Not on file  Relationships  . Social Musician on phone: Not on file    Gets together: Not on file    Attends religious service: Not on file    Active member of club or organization: Not on  file    Attends meetings of clubs or organizations: Not on file    Relationship status: Not on file  . Intimate partner violence    Fear of current or ex partner: Not on file    Emotionally abused: Not on file    Physically abused: Not on file    Forced sexual activity: Not on file  Other Topics Concern  . Not on file  Social History Narrative   Patient works as a Corporate treasurer with [employer] she is married to [place]. And has two children. She exercises 2 days a wk for an 1 hour.  Her sister died suddenty at 45 unknown cause and this makes her very anxious.      Outpatient Medications Prior to Visit  Medication Sig Dispense Refill  . amoxicillin-clavulanate (AUGMENTIN) 875-125 MG tablet Take 1 tablet by mouth 2 (two) times daily. 20 tablet 0  . cetirizine (ZYRTEC) 10 MG tablet Take 10 mg by mouth daily.    . fluticasone (FLONASE) 50 MCG/ACT nasal spray Place into the nose.    . ibuprofen (ADVIL,MOTRIN) 800 MG tablet Take 1 tablet (800 mg total) by mouth every 8 (eight) hours as needed. 30 tablet 2   No facility-administered medications prior to visit.     Allergies  Allergen Reactions  . Codeine Phosphate  Itching  . Buprenorphine Hcl Rash and Swelling  . Morphine And Related Swelling and Rash    ROS Review of Systems    Objective:    Physical Exam  Constitutional: She is oriented to person, place, and time. She appears well-developed and well-nourished.  HENT:  Head: Normocephalic and atraumatic.  Eyes: Conjunctivae and EOM are normal.  Cardiovascular: Normal rate.  Pulmonary/Chest: Effort normal.  Musculoskeletal:     Comments: Right shoulder w/ NROM. Pain with extension with 90 degrees.    Neurological: She is alert and oriented to person, place, and time.  Skin: Skin is dry. No pallor.  The lesion on the left posterior forearm is still about the same width but it is not as raised. No erythema.    Psychiatric: She has a normal mood and affect. Her behavior is normal.  Vitals reviewed.   BP 109/60   Pulse 81   Ht 5\' 2"  (1.575 m)   Wt 171 lb (77.6 kg)   SpO2 99%   BMI 31.28 kg/m  Wt Readings from Last 3 Encounters:  10/26/18 171 lb (77.6 kg)  10/15/18 170 lb (77.1 kg)  09/21/18 171 lb (77.6 kg)    There are no preventive care reminders to display for this patient.  There are no preventive care reminders to display for this patient.  Lab Results  Component Value Date   TSH 2.21 08/02/2017   Lab Results  Component Value Date   WBC 6.6 10/15/2018   HGB 12.2 10/15/2018   HCT 38.0 10/15/2018   MCV 83.5 10/15/2018   PLT 371 10/15/2018   Lab Results  Component Value Date   NA 140 08/02/2017   K 4.8 08/02/2017   CO2 29 08/02/2017   GLUCOSE 95 08/02/2017   BUN 18 08/02/2017   CREATININE 0.98 08/02/2017   BILITOT 0.5 08/02/2017   ALKPHOS 79 07/29/2016   AST 19 08/02/2017   ALT 19 08/02/2017   PROT 6.7 08/02/2017   ALBUMIN 4.0 07/29/2016   CALCIUM 9.1 08/02/2017   Lab Results  Component Value Date   CHOL 191 08/02/2017   Lab Results  Component Value Date   HDL 55  08/02/2017   Lab Results  Component Value Date   LDLCALC 121 (H) 08/02/2017   Lab  Results  Component Value Date   TRIG 54 08/02/2017   Lab Results  Component Value Date   CHOLHDL 3.5 08/02/2017   No results found for: HGBA1C    Assessment & Plan:   Problem List Items Addressed This Visit    None    Visit Diagnoses    Acute pain of right shoulder    -  Primary   Acute vaginitis       Mass of left upper extremity         Vaginitis -she did take a Diflucan last night but did go ahead and send in an extra tab today in case she needs to repeat the dose on Sunday night.  Mass -gave reassurance.  It is definitely flattened but it still has the same with to it.  We discussed option of continuing with antibiotics.  She would prefer to hold off and just see how it does over the weekend.  If it continues to resolve great.  If not then consider more definitive treatment with removal.  And if it starts to swell or get red again then recommend repeating the antibiotics and possible I&D based on the ultrasound results.  Right shoulder pain-most consistent with bursitis.  She has been doing refit and I suspect this is likely aggravated it.  She still has normal range of motion but does have pain with the range of motion.  Recommend icing anti-inflammatory and given handout on stretches to do on her own.  Also recommend that she avoid any overhead exercises during her refit workout.  Meds ordered this encounter  Medications  . fluconazole (DIFLUCAN) 150 MG tablet    Sig: Take 1 tablet (150 mg total) by mouth daily. Repeat after 3 days if needed.    Dispense:  2 tablet    Refill:  1    Follow-up: No follow-ups on file.    Nani Gasseratherine Terik Haughey, MD

## 2018-10-31 ENCOUNTER — Encounter: Payer: Self-pay | Admitting: Family Medicine

## 2018-11-03 ENCOUNTER — Other Ambulatory Visit: Payer: Self-pay | Admitting: Physician Assistant

## 2018-11-03 DIAGNOSIS — L259 Unspecified contact dermatitis, unspecified cause: Secondary | ICD-10-CM

## 2018-12-17 ENCOUNTER — Ambulatory Visit (INDEPENDENT_AMBULATORY_CARE_PROVIDER_SITE_OTHER): Payer: BC Managed Care – PPO | Admitting: Sports Medicine

## 2018-12-17 ENCOUNTER — Ambulatory Visit (INDEPENDENT_AMBULATORY_CARE_PROVIDER_SITE_OTHER): Payer: BC Managed Care – PPO

## 2018-12-17 ENCOUNTER — Other Ambulatory Visit: Payer: Self-pay

## 2018-12-17 DIAGNOSIS — M25511 Pain in right shoulder: Secondary | ICD-10-CM

## 2018-12-17 DIAGNOSIS — G8929 Other chronic pain: Secondary | ICD-10-CM

## 2018-12-17 DIAGNOSIS — M7541 Impingement syndrome of right shoulder: Secondary | ICD-10-CM | POA: Insufficient documentation

## 2018-12-17 MED ORDER — TRAMADOL HCL 50 MG PO TABS: 50.0000 mg | ORAL_TABLET | Freq: Three times a day (TID) | ORAL | 0 refills | Status: DC | PRN

## 2018-12-17 NOTE — Progress Notes (Signed)
Subjective:    CC: Right shoulder pain  HPI: Sheryl Taylor is a pleasant 48 year old female with several months of severe pain in her right shoulder, localized over the deltoid and worse with abduction, also gets pain into the axilla.  She has had several falls, moderate weakness.  I reviewed the past medical history, family history, social history, surgical history, and allergies today and no changes were needed.  Please see the problem list section below in epic for further details.  Past Medical History: Past Medical History:  Diagnosis Date  . Asthma   . Bronchitis   . Hyperlipidemia   . Thyroid nodule    Past Surgical History: Past Surgical History:  Procedure Laterality Date  . CESAREAN SECTION     x 2   . CHOLECYSTECTOMY  2003   Social History: Social History   Socioeconomic History  . Marital status: Married    Spouse name: Sheryl Taylor   . Number of children: 2  . Years of education: Not on file  . Highest education level: Not on file  Occupational History  . Occupation: Geologist, engineering    Comment: [employer]  Social Needs  . Financial resource strain: Not on file  . Food insecurity    Worry: Not on file    Inability: Not on file  . Transportation needs    Medical: Not on file    Non-medical: Not on file  Tobacco Use  . Smoking status: Never Smoker  . Smokeless tobacco: Never Used  Substance and Sexual Activity  . Alcohol use: No    Alcohol/week: 0.0 standard drinks  . Drug use: No  . Sexual activity: Yes    Partners: Male  Lifestyle  . Physical activity    Days per week: Not on file    Minutes per session: Not on file  . Stress: Not on file  Relationships  . Social Musician on phone: Not on file    Gets together: Not on file    Attends religious service: Not on file    Active member of club or organization: Not on file    Attends meetings of clubs or organizations: Not on file    Relationship status: Not on file  Other Topics Concern  . 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 And has two children. She exercises 2 days a wk for an 1 hour.  Her sister died suddenty at 85 unknown cause and this makes her very anxious.     Family History: Family History  Problem Relation Age of Onset  . Lung cancer Father        lung (50)  . Heart failure Father        (41)  . Hyperlipidemia Father   . CAD Father   . Breast cancer Unknown        great aunt  . Breast cancer Unknown        cousin  . Heart attack Paternal Uncle        bypass surgery   . Breast cancer Maternal Aunt    Allergies: Allergies  Allergen Reactions  . Codeine Phosphate Itching  . Buprenorphine Hcl Rash and Swelling  . Morphine And Related Swelling and Rash   Medications: See med rec.  Review of Systems: No fevers, chills, night sweats, weight loss, chest pain, or shortness of breath.   Objective:    General: Well Developed,  well nourished, and in no acute distress.  Neuro: Alert and oriented x3, extra-ocular muscles intact, sensation grossly intact.  HEENT: Normocephalic, atraumatic, pupils equal round reactive to light, neck supple, no masses, no lymphadenopathy, thyroid nonpalpable.  Skin: Warm and dry, no rashes. Cardiac: Regular rate and rhythm, no murmurs rubs or gallops, no lower extremity edema.  Respiratory: Clear to auscultation bilaterally. Not using accessory muscles, speaking in full sentences. Right shoulder: Inspection reveals no abnormalities, atrophy or asymmetry. Palpation is normal with no tenderness over AC joint or bicipital groove. ROM is full in all planes. Rotator cuff strength weak to abduction Positive Neer and Hawkin's tests, empty can. Speeds and Yergason's tests normal. No labral pathology noted with negative Obrien's, negative crank, negative clunk, and good stability. Normal scapular function observed. No painful arc and no drop arm sign. No  apprehension sign  Procedure: Real-time Ultrasound Guided injection of the right subacromial bursa Device: Samsung HS60  Verbal informed consent obtained.  Time-out conducted.  Noted no overlying erythema, induration, or other signs of local infection.  Skin prepped in a sterile fashion.  Local anesthesia: Topical Ethyl chloride.  With sterile technique and under real time ultrasound guidance: 1 cc Kenalog 40, 1 cc lidocaine, 1 cc bupivacaine injected easily Completed without difficulty  Pain immediately resolved suggesting accurate placement of the medication.  Advised to call if fevers/chills, erythema, induration, drainage, or persistent bleeding.  Images permanently stored and available for review in the ultrasound unit.  Impression: Technically successful ultrasound guided injection.  Impression and Recommendations:    Right shoulder pain Pain for several months, impingement related. She does have some glenohumeral type symptoms as well. Adding physical therapy, we did a subacromial injection today, tramadol can be used for breakthrough pain on top of ibuprofen. I am seeing some arthritis in the glenohumeral joint on ultrasound. Return to see me in a month, MRI versus glenohumeral injection if no better.   ___________________________________________ Gwen Her. Dianah Field, M.D., ABFM., CAQSM. Primary Care and Sports Medicine [facility]  Adjunct Professor of [employer]

## 2018-12-17 NOTE — Assessment & Plan Note (Signed)
Pain for several months, impingement related. She does have some glenohumeral type symptoms as well. Adding physical therapy, we did a subacromial injection today, tramadol can be used for breakthrough pain on top of ibuprofen. I am seeing some arthritis in the glenohumeral joint on ultrasound. Return to see me in a month, MRI versus glenohumeral injection if no better.

## 2018-12-19 ENCOUNTER — Ambulatory Visit: Payer: BC Managed Care – PPO | Admitting: Sports Medicine

## 2019-01-28 ENCOUNTER — Ambulatory Visit: Payer: BC Managed Care – PPO | Admitting: Sports Medicine

## 2019-01-28 ENCOUNTER — Encounter: Payer: Self-pay | Admitting: Family Medicine

## 2019-01-28 ENCOUNTER — Other Ambulatory Visit: Payer: Self-pay

## 2019-01-28 ENCOUNTER — Ambulatory Visit (INDEPENDENT_AMBULATORY_CARE_PROVIDER_SITE_OTHER): Payer: BC Managed Care – PPO | Admitting: Family Medicine

## 2019-01-28 VITALS — BP 116/69 | HR 75 | Ht 62.0 in | Wt 174.0 lb

## 2019-01-28 DIAGNOSIS — R10824 Left lower quadrant rebound abdominal tenderness: Secondary | ICD-10-CM

## 2019-01-28 DIAGNOSIS — R1084 Generalized abdominal pain: Secondary | ICD-10-CM

## 2019-01-28 LAB — POCT URINALYSIS DIP (CLINITEK)
Bilirubin, UA: NEGATIVE
Blood, UA: NEGATIVE
Glucose, UA: NEGATIVE mg/dL
Ketones, POC UA: NEGATIVE mg/dL
Leukocytes, UA: NEGATIVE
Nitrite, UA: NEGATIVE
POC PROTEIN,UA: NEGATIVE
Spec Grav, UA: 1.01 (ref 1.010–1.025)
Urobilinogen, UA: 0.2 E.U./dL
pH, UA: 5.5 (ref 5.0–8.0)

## 2019-01-28 LAB — COMPLETE METABOLIC PANEL WITH GFR
AG Ratio: 1.6 (calc) (ref 1.0–2.5)
ALT: 10 U/L (ref 6–29)
AST: 14 U/L (ref 10–35)
Albumin: 4.1 g/dL (ref 3.6–5.1)
Alkaline phosphatase (APISO): 77 U/L (ref 31–125)
BUN: 12 mg/dL (ref 7–25)
CO2: 24 mmol/L (ref 20–32)
Calcium: 9.2 mg/dL (ref 8.6–10.2)
Chloride: 108 mmol/L (ref 98–110)
Creat: 0.93 mg/dL (ref 0.50–1.10)
GFR, Est African American: 85 mL/min/{1.73_m2} (ref >=60)
GFR, Est Non African American: 74 mL/min/{1.73_m2} (ref >=60)
Globulin: 2.6 g/dL (calc) (ref 1.9–3.7)
Glucose, Bld: 88 mg/dL (ref 65–139)
Potassium: 4.1 mmol/L (ref 3.5–5.3)
Sodium: 141 mmol/L (ref 135–146)
Total Bilirubin: 0.4 mg/dL (ref 0.2–1.2)
Total Protein: 6.7 g/dL (ref 6.1–8.1)

## 2019-01-28 LAB — CBC WITH DIFFERENTIAL/PLATELET
Absolute Monocytes: 599 cells/uL (ref 200–950)
Basophils Absolute: 32 cells/uL (ref 0–200)
Basophils Relative: 0.4 %
Eosinophils Absolute: 73 cells/uL (ref 15–500)
Eosinophils Relative: 0.9 %
HCT: 35.8 % (ref 35.0–45.0)
Hemoglobin: 11.3 g/dL — ABNORMAL LOW (ref 11.7–15.5)
Lymphs Abs: 2616 cells/uL (ref 850–3900)
MCH: 26.2 pg — ABNORMAL LOW (ref 27.0–33.0)
MCHC: 31.6 g/dL — ABNORMAL LOW (ref 32.0–36.0)
MCV: 83.1 fL (ref 80.0–100.0)
MPV: 10.4 fL (ref 7.5–12.5)
Monocytes Relative: 7.4 %
Neutro Abs: 4779 cells/uL (ref 1500–7800)
Neutrophils Relative %: 59 %
Platelets: 347 10*3/uL (ref 140–400)
RBC: 4.31 10*6/uL (ref 3.80–5.10)
RDW: 14.4 % (ref 11.0–15.0)
Total Lymphocyte: 32.3 %
WBC: 8.1 10*3/uL (ref 3.8–10.8)

## 2019-01-28 MED ORDER — IBUPROFEN 800 MG PO TABS: 800.0000 mg | ORAL_TABLET | Freq: Three times a day (TID) | ORAL | 2 refills | Status: DC | PRN

## 2019-01-28 NOTE — Progress Notes (Signed)
Established Patient Office Visit  Subjective:  Patient ID: Sheryl Taylor, female    DOB: 05/28/1970  Age: 49 y.o. MRN: [id number]  CC:  Chief Complaint  Patient presents with  . Abdominal Pain    HPI Sheryl Taylor presents for   Pt reports that on Friday night around 530 PM she ate some wings for dinner.  At 1230 AM she stated that she began to have really bad abdominal pains. She took Tylenol 500 mg (1). She states that this didn't help. At 4 AM she took 800 mg of Motriin. Still no relief from the pain. She had a normal BM on Saturday and yesterday.   She now reports that her stomach is just very sore. Denies any f/s/c/n/v/d.  No blood in the stool or urine.  She says now it is just sore.  It is worse if she goes over a bump while riding in the car or with walking it is more noticeable.  She points to the mid abdominal area. She last had something to eat about 1 hour ago. 1 mini doughnut and 3 cheese bites.  He has been able to eat normally without any nausea or decreased appetite.  Past Medical History:  Diagnosis Date  . Asthma   . Bronchitis   . Hyperlipidemia   . Thyroid nodule     Past Surgical History:  Procedure Laterality Date  . CESAREAN SECTION     x 2   . CHOLECYSTECTOMY  2003    Family History  Problem Relation Age of Onset  . Lung cancer Father        lung (50)  . Heart failure Father        (41)  . Hyperlipidemia Father   . CAD Father   . Breast cancer Unknown        great aunt  . Breast cancer Unknown        cousin  . Heart attack Paternal Uncle        bypass surgery   . Breast cancer Maternal Aunt     Social History   Socioeconomic History  . Marital status: Married    Spouse name: Karleen Hampshire   . Number of children: 2  . Years of education: Not on file  . Highest education level: Not on file  Occupational History  . Occupation: Geologist, engineering    Comment: [employer]  Tobacco Use  . Smoking status: Never Smoker  . Smokeless tobacco: Never Used   Substance and Sexual Activity  . Alcohol use: No    Alcohol/week: 0.0 standard drinks  . Drug use: No  . Sexual activity: Yes    Partners: Male  Other Topics Concern  . 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 And has two children. She exercises 2 days a wk for an 1 hour.  Her sister died suddenty at 40 unknown cause and this makes her very anxious.     Social Determinants of Health   Financial Resource Strain:   . Difficulty of Paying Living Expenses: Not on file  Food Insecurity:   . Worried About Programme researcher, broadcasting/film/video in the Last Year: Not on file  . Ran Out of Food in the Last Year: Not on file  Transportation Needs:   . Lack of Transportation (Medical): Not on file  . Lack of Transportation (Non-Medical): Not on file  Physical Activity:   . Days of  Exercise per Week: Not on file  . Minutes of Exercise per Session: Not on file  Stress:   . Feeling of Stress : Not on file  Social Connections:   . Frequency of Communication with Friends and Family: Not on file  . Frequency of Social Gatherings with Friends and Family: Not on file  . Attends Religious Services: Not on file  . Active Member of Clubs or Organizations: Not on file  . Attends Archivist Meetings: Not on file  . Marital Status: Not on file  Intimate Partner Violence:   . Fear of Current or Ex-Partner: Not on file  . Emotionally Abused: Not on file  . Physically Abused: Not on file  . Sexually Abused: Not on file    Outpatient Medications Prior to Visit  Medication Sig Dispense Refill  . cetirizine (ZYRTEC) 10 MG tablet Take 10 mg by mouth daily.    . fluticasone (FLONASE) 50 MCG/ACT nasal spray Place into the nose.    . ibuprofen (ADVIL,MOTRIN) 800 MG tablet Take 1 tablet (800 mg total) by mouth every 8 (eight) hours as needed. 30 tablet 2  . PREVIDENT 5000 BOOSTER PLUS 1.1 % PSTE USE ONCE DAILY IN PLACE OF REGULAR TOOTHPASTE     . traMADol (ULTRAM) 50 MG tablet Take 1 tablet (50 mg total) by mouth every 8 (eight) hours as needed for moderate pain. Maximum 6 tabs per day. 21 tablet 0   No facility-administered medications prior to visit.    Allergies  Allergen Reactions  . Codeine Phosphate Itching  . Buprenorphine Hcl Rash and Swelling  . Morphine And Related Swelling and Rash    ROS Review of Systems    Objective:    Physical Exam  Constitutional: She is oriented to person, place, and time. She appears well-developed and well-nourished.  HENT:  Head: Normocephalic and atraumatic.  Eyes: Conjunctivae and EOM are normal.  Cardiovascular: Normal rate, regular rhythm and normal heart sounds.  Pulmonary/Chest: Effort normal and breath sounds normal.  Abdominal: Soft. Bowel sounds are normal. She exhibits no distension and no mass. There is abdominal tenderness. There is no rebound and no guarding.  Tender to palpation in the right and left lower quadrants and suprapubic area but the most tender in the left lower quadrant.  Neurological: She is alert and oriented to person, place, and time.  Skin: Skin is warm and dry. No pallor.  Psychiatric: She has a normal mood and affect. Her behavior is normal.  Vitals reviewed.   BP 116/69   Pulse 75   Ht 5\' 2"  (1.575 m)   Wt 174 lb (78.9 kg)   LMP 01/03/2019 (Exact Date)   SpO2 100%   BMI 31.83 kg/m  Wt Readings from Last 3 Encounters:  01/28/19 174 lb (78.9 kg)  12/17/18 171 lb (77.6 kg)  10/26/18 171 lb (77.6 kg)     Health Maintenance Due  Topic Date Due  . PAP SMEAR-Modifier  03/12/2019    There are no preventive care reminders to display for this patient.  Lab Results  Component Value Date   TSH 2.21 08/02/2017   Lab Results  Component Value Date   WBC 6.6 10/15/2018   HGB 12.2 10/15/2018   HCT 38.0 10/15/2018   MCV 83.5 10/15/2018   PLT 371 10/15/2018   Lab Results  Component Value Date   NA 140 08/02/2017   K 4.8 08/02/2017    CO2 29 08/02/2017   GLUCOSE 95 08/02/2017   BUN  18 08/02/2017   CREATININE 0.98 08/02/2017   BILITOT 0.5 08/02/2017   ALKPHOS 79 07/29/2016   AST 19 08/02/2017   ALT 19 08/02/2017   PROT 6.7 08/02/2017   ALBUMIN 4.0 07/29/2016   CALCIUM 9.1 08/02/2017   Lab Results  Component Value Date   CHOL 191 08/02/2017   Lab Results  Component Value Date   HDL 55 08/02/2017   Lab Results  Component Value Date   LDLCALC 121 (H) 08/02/2017   Lab Results  Component Value Date   TRIG 54 08/02/2017   Lab Results  Component Value Date   CHOLHDL 3.5 08/02/2017   No results found for: HGBA1C    Assessment & Plan:   Problem List Items Addressed This Visit    None    Visit Diagnoses    Abdominal pain, generalized    -  Primary   Relevant Orders   CBC with Differential/Platelet   COMPLETE METABOLIC PANEL WITH GFR   POCT URINALYSIS DIP (CLINITEK) (Completed)   Left lower quadrant abdominal tenderness with rebound tenderness       Relevant Orders   CBC with Differential/Platelet   COMPLETE METABOLIC PANEL WITH GFR      Mid abdominal pain with left lower quadrant tenderness-she is feeling better but still sore today.  Discussed may be a mild case of diverticulitis.  Discussed the possibility of CT for additional work-up if her CBC is elevated we will get labs today.  Since she is feeling better I think it is reasonable to watch it over the next couple days and see if she continues to improve.  If at any point she develops worsening pain or new symptoms then please give [place] call back.  She also only has a bowel movement twice a week so consider it could be constipation related recommend a trial of MiraLAX at home as well.  She feels like she has been going regularly.  No orders of the defined types were placed in this encounter.   Follow-up: Return if symptoms worsen or fail to improve.    Nani Gasser, MD

## 2019-01-28 NOTE — Progress Notes (Signed)
Pt reports that on Friday night around 530 PM she ate some wings for dinner.   At 1230 AM she stated that she began to have really bad abdominal pains. She took Tylenol 500 mg (1). She states that this didn't help.   At 4 AM she took 800 mg of Motriin. Still no relief from the pain.  She had a normal BM on Saturday and yesterday.  She now reports that her stomach is just very sore.   Denies any f/s/c/n/v/d.  She last had something to eat about 1 hour ago. 1 mini doughnut and 3 cheese bites.

## 2019-02-12 ENCOUNTER — Ambulatory Visit (INDEPENDENT_AMBULATORY_CARE_PROVIDER_SITE_OTHER): Payer: BC Managed Care – PPO | Admitting: Family Medicine

## 2019-02-12 ENCOUNTER — Encounter: Payer: Self-pay | Admitting: Family Medicine

## 2019-02-12 ENCOUNTER — Other Ambulatory Visit: Payer: Self-pay

## 2019-02-12 VITALS — BP 108/67 | HR 76 | Ht 62.0 in | Wt 171.0 lb

## 2019-02-12 DIAGNOSIS — R1032 Left lower quadrant pain: Secondary | ICD-10-CM

## 2019-02-12 DIAGNOSIS — R102 Pelvic and perineal pain: Secondary | ICD-10-CM | POA: Diagnosis not present

## 2019-02-12 NOTE — Progress Notes (Signed)
Established Patient Office Visit  Subjective:  Patient ID: Sheryl Taylor, female    DOB: Aug 27, 1970  Age: 49 y.o. MRN: [id number]  CC:  Chief Complaint  Patient presents with  . Abdominal Pain    HPI Sheryl Taylor presents for   Pt reports that she continues to experience low abdominal pain x 2 weeks. She stated that when she walks, sits, or uses the bathroom she has a dull pain. At rest it is not bothersome and says the pain is very mild in fact says it is more of a pressure really.  She denies any new onset urinary symptoms such as abnormal odor, or blood in the urine or frequency or urgency.  Her BM's have been normal. Denies any f/s/c/n/v/d.  Please see prior notes on when pain started. Hx of Tubal.  She normally only has a bowel movement about twice a week but more recently has been going very regularly she says she has not had to strain.  No blood in the stool. She reports the pain started after intercourse initially. She hasn't mentioned that recently. She did have intercourse this past weekend and says it was only uncomfortable.  She denies any vaginal discharge.  She goes yearly to see her GYN and says her exam is up-to-date.  In fact she is due to go back in April.  Reports that her last Pap smear was normal.  She has never had any history of ovarian cysts or uterine fibroids.  Labs were normal as well.  Past Medical History:  Diagnosis Date  . Asthma   . Bronchitis   . Hyperlipidemia   . Thyroid nodule     Past Surgical History:  Procedure Laterality Date  . CESAREAN SECTION     x 2   . CHOLECYSTECTOMY  2003    Family History  Problem Relation Age of Onset  . Lung cancer Father        lung (50)  . Heart failure Father        (41)  . Hyperlipidemia Father   . CAD Father   . Breast cancer Unknown        great aunt  . Breast cancer Unknown        cousin  . Heart attack Paternal Uncle        bypass surgery   . Breast cancer Maternal Aunt     Social History    Socioeconomic History  . Marital status: Married    Spouse name: Karleen Hampshire   . Number of children: 2  . Years of education: Not on file  . Highest education level: Not on file  Occupational History  . Occupation: Geologist, engineering    Comment: [employer]  Tobacco Use  . Smoking status: Never Smoker  . Smokeless tobacco: Never Used  Substance and Sexual Activity  . Alcohol use: No    Alcohol/week: 0.0 standard drinks  . Drug use: No  . Sexual activity: Yes    Partners: Male  Other Topics Concern  . 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 And has two children. She exercises 2 days a wk for an 1 hour.  Her sister died suddenty at 45 unknown cause and this makes her very anxious.     Social Determinants of Health   Financial Resource Strain:   . Difficulty of Paying Living Expenses: Not on file  Food Insecurity:   . Worried  About Running Out of Food in the Last Year: Not on file  . Ran Out of Food in the Last Year: Not on file  Transportation Needs:   . Lack of Transportation (Medical): Not on file  . Lack of Transportation (Non-Medical): Not on file  Physical Activity:   . Days of Exercise per Week: Not on file  . Minutes of Exercise per Session: Not on file  Stress:   . Feeling of Stress : Not on file  Social Connections:   . Frequency of Communication with Friends and Family: Not on file  . Frequency of Social Gatherings with Friends and Family: Not on file  . Attends Religious Services: Not on file  . Active Member of Clubs or Organizations: Not on file  . Attends Banker Meetings: Not on file  . Marital Status: Not on file  Intimate Partner Violence:   . Fear of Current or Ex-Partner: Not on file  . Emotionally Abused: Not on file  . Physically Abused: Not on file  . Sexually Abused: Not on file    Outpatient Medications Prior to Visit  Medication Sig Dispense Refill  .  cetirizine (ZYRTEC) 10 MG tablet Take 10 mg by mouth daily.    . fluticasone (FLONASE) 50 MCG/ACT nasal spray Place into the nose.    . ibuprofen (ADVIL) 800 MG tablet Take 1 tablet (800 mg total) by mouth every 8 (eight) hours as needed. 30 tablet 2  . PREVIDENT 5000 BOOSTER PLUS 1.1 % PSTE USE ONCE DAILY IN PLACE OF REGULAR TOOTHPASTE     No facility-administered medications prior to visit.    Allergies  Allergen Reactions  . Codeine Phosphate Itching  . Buprenorphine Hcl Rash and Swelling  . Morphine And Related Swelling and Rash    ROS Review of Systems    Objective:    Physical Exam  Constitutional: She is oriented to person, place, and time. She appears well-developed and well-nourished.  HENT:  Head: Normocephalic and atraumatic.  Cardiovascular: Normal rate, regular rhythm and normal heart sounds.  Pulmonary/Chest: Effort normal and breath sounds normal.  Abdominal: Soft. Bowel sounds are normal. She exhibits no distension and no mass. There is abdominal tenderness. There is no rebound.  + TTP  In the suprapubic area and the LLQ pain.    Neurological: She is alert and oriented to person, place, and time.  Skin: Skin is warm and dry.  Psychiatric: She has a normal mood and affect. Her behavior is normal.    BP 108/67   Pulse 76   Ht 5\' 2"  (1.575 m)   Wt 171 lb (77.6 kg)   LMP 01/31/2019 (Exact Date)   SpO2 100%   BMI 31.28 kg/m  Wt Readings from Last 3 Encounters:  02/12/19 171 lb (77.6 kg)  01/28/19 174 lb (78.9 kg)  12/17/18 171 lb (77.6 kg)    There are no preventive care reminders to display for this patient.  There are no preventive care reminders to display for this patient.  Lab Results  Component Value Date   TSH 2.21 08/02/2017   Lab Results  Component Value Date   WBC 8.1 01/28/2019   HGB 11.3 (L) 01/28/2019   HCT 35.8 01/28/2019   MCV 83.1 01/28/2019   PLT 347 01/28/2019   Lab Results  Component Value Date   NA 141 01/28/2019   K  4.1 01/28/2019   CO2 24 01/28/2019   GLUCOSE 88 01/28/2019   BUN 12 01/28/2019  CREATININE 0.93 01/28/2019   BILITOT 0.4 01/28/2019   ALKPHOS 79 07/29/2016   AST 14 01/28/2019   ALT 10 01/28/2019   PROT 6.7 01/28/2019   ALBUMIN 4.0 07/29/2016   CALCIUM 9.2 01/28/2019   Lab Results  Component Value Date   CHOL 191 08/02/2017   Lab Results  Component Value Date   HDL 55 08/02/2017   Lab Results  Component Value Date   LDLCALC 121 (H) 08/02/2017   Lab Results  Component Value Date   TRIG 54 08/02/2017   Lab Results  Component Value Date   CHOLHDL 3.5 08/02/2017   No results found for: HGBA1C    Assessment & Plan:   Problem List Items Addressed This Visit    None    Visit Diagnoses    Pelvic pain    -  Primary   Relevant Orders   US Pelvic Complete With Transvaginal   LLQ pain       Relevant Orders   US Pelvic Complete With Transvaginal     Pelvic pain-unclear etiology consider ovarian cyst versus uterine fibroid etc.  We discussed moving forward with an ultrasound versus CT.  The CT would have the additional benefit of looking at the bowels but she wants to hold off on that for now because of a large deductible.  So we will move forward with ultrasound for further work-up at this point.  If she develops any new symptoms or urinary frequency or dysuria then please let us know.  She does have a history of tubal ligation so tubal pregnancy is always a possibility as she is sexually active with her husband.  Can always get an early with her GYN as well if needed for a full pelvic exam.  LLQ pain - W/U as above.  Bowels seem to be moving better which is great.    No orders of the defined types were placed in this encounter.   Follow-up: Return if symptoms worsen or fail to improve.    Beatrice Lecher, MD

## 2019-02-12 NOTE — Progress Notes (Signed)
Pt reports that she continues to experience low abdominal pain. She stated that when she walks, sits, or uses the bathroom she has a dull pain. Her BM's have been normal. Denies any f/s/c/n/v/d

## 2019-02-13 ENCOUNTER — Ambulatory Visit (INDEPENDENT_AMBULATORY_CARE_PROVIDER_SITE_OTHER): Payer: BC Managed Care – PPO

## 2019-02-13 ENCOUNTER — Encounter: Payer: Self-pay | Admitting: Family Medicine

## 2019-02-13 DIAGNOSIS — R1032 Left lower quadrant pain: Secondary | ICD-10-CM

## 2019-02-13 DIAGNOSIS — R102 Pelvic and perineal pain: Secondary | ICD-10-CM

## 2019-02-14 ENCOUNTER — Other Ambulatory Visit: Payer: Self-pay

## 2019-02-14 ENCOUNTER — Ambulatory Visit (INDEPENDENT_AMBULATORY_CARE_PROVIDER_SITE_OTHER): Payer: BC Managed Care – PPO | Admitting: Medical-Surgical

## 2019-02-14 VITALS — Ht 62.0 in

## 2019-02-14 DIAGNOSIS — Z712 Person consulting for explanation of examination or test findings: Secondary | ICD-10-CM

## 2019-02-14 DIAGNOSIS — Z3202 Encounter for pregnancy test, result negative: Secondary | ICD-10-CM | POA: Diagnosis not present

## 2019-02-14 LAB — POCT URINE PREGNANCY: Preg Test, Ur: NEGATIVE

## 2019-02-14 NOTE — Progress Notes (Signed)
Patient presents to clinic for a urine pregnancy per her PCP from imaging that was completed on 02/13/2019. The test was negative. The patient come in with questions and joy had to fill in and talk with the patient. Patient is aware that her test is negative and she was relieved.

## 2019-02-14 NOTE — Progress Notes (Signed)
Subjective:    CC: Questions about test result  HPI: Pleasant 49 year old female in office today to have a urine pregnancy test completed.   Had many questions regarding her pelvic ultrasound result that was completed yesterday. She was very worried after reading the results and wanted to speak to Dr. Linford Arnold but she is out of the office.   I reviewed the past medical history, family history, social history, surgical history, and allergies today and no changes were needed.  Please see the problem list section below in epic for further details.  Past Medical History: Past Medical History:  Diagnosis Date  . Asthma   . Bronchitis   . Hyperlipidemia   . Thyroid nodule    Past Surgical History: Past Surgical History:  Procedure Laterality Date  . CESAREAN SECTION     x 2   . CHOLECYSTECTOMY  2003   Social History: Social History   Socioeconomic History  . Marital status: Married    Spouse name: Karleen [place]   . Number of children: 2  . Years of education: Not on file  . Highest education level: Not on file  Occupational History  . Occupation: Geologist, engineering    Comment: [employer]  Tobacco Use  . Smoking status: Never Smoker  . Smokeless tobacco: Never Used  Substance and Sexual Activity  . Alcohol use: No    Alcohol/week: 0.0 standard drinks  . Drug use: No  . Sexual activity: Yes    Partners: Male  Other Topics Concern  . 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 And has two children. She exercises 2 days a wk for an 1 hour.  Her sister died suddenty at 23 unknown cause and this makes her very anxious.     Social Determinants of Health   Financial Resource Strain:   . Difficulty of Paying Living Expenses: Not on file  Food Insecurity:   . Worried About Programme researcher, broadcasting/film/video in the Last Year: Not on file  . Ran Out of Food in the Last Year: Not on file  Transportation Needs:   . Lack of  Transportation (Medical): Not on file  . Lack of Transportation (Non-Medical): Not on file  Physical Activity:   . Days of Exercise per Week: Not on file  . Minutes of Exercise per Session: Not on file  Stress:   . Feeling of Stress : Not on file  Social Connections:   . Frequency of Communication with Friends and Family: Not on file  . Frequency of Social Gatherings with Friends and Family: Not on file  . Attends Religious Services: Not on file  . Active Member of Clubs or Organizations: Not on file  . Attends Banker Meetings: Not on file  . Marital Status: Not on file   Family History: Family History  Problem Relation Age of Onset  . Lung cancer Father        lung (50)  . Heart failure Father        (41)  . Hyperlipidemia Father   . CAD Father   . Breast cancer Unknown        great aunt  . Breast cancer Unknown        cousin  . Heart attack Paternal Uncle        bypass surgery   . Breast cancer Maternal Aunt    Allergies: Allergies  Allergen Reactions  . Codeine  Phosphate Itching  . Buprenorphine Hcl Rash and Swelling  . Morphine And Related Swelling and Rash   Medications: See med rec.  Review of Systems: No fevers, chills, night sweats, weight loss, chest pain, or shortness of breath.   Objective:    General: Well Developed, well nourished, and in no acute distress.  Neuro: Alert and oriented x3, extra-ocular muscles intact, sensation grossly intact.  HEENT: Normocephalic, atraumatic.  Skin: Warm and dry.  Respiratory: Not using accessory muscles, speaking in full sentences.   Impression and Recommendations:    Hemorrhagic ovarian cyst Discussed results and PCP comments for transvaginal pelvic ultrasound completed 02/13/2019.  All questions answered and patient verbalized understanding.  Reviewed symptoms that indicate need for emergency care.  Return As instructed in 6 to 12 weeks for repeat pelvic  ultrasound.  ___________________________________________ Clearnce Sorrel, DNP, APRN, FNP-BC Primary Care and Sports Medicine [place]

## 2019-02-18 ENCOUNTER — Encounter: Payer: Self-pay | Admitting: Family Medicine

## 2019-02-20 DIAGNOSIS — R1909 Other intra-abdominal and pelvic swelling, mass and lump: Secondary | ICD-10-CM | POA: Diagnosis not present

## 2019-03-19 DIAGNOSIS — N83202 Unspecified ovarian cyst, left side: Secondary | ICD-10-CM | POA: Diagnosis not present

## 2019-03-19 DIAGNOSIS — R102 Pelvic and perineal pain: Secondary | ICD-10-CM | POA: Diagnosis not present

## 2019-03-20 DIAGNOSIS — D3910 Neoplasm of uncertain behavior of unspecified ovary: Secondary | ICD-10-CM | POA: Diagnosis not present

## 2019-03-20 DIAGNOSIS — N83209 Unspecified ovarian cyst, unspecified side: Secondary | ICD-10-CM | POA: Diagnosis not present

## 2019-04-17 DIAGNOSIS — Z130 Encounter for screening for diseases of the blood and blood-forming organs and certain disorders involving the immune mechanism: Secondary | ICD-10-CM | POA: Diagnosis not present

## 2019-04-17 DIAGNOSIS — N920 Excessive and frequent menstruation with regular cycle: Secondary | ICD-10-CM | POA: Diagnosis not present

## 2019-04-17 DIAGNOSIS — D3910 Neoplasm of uncertain behavior of unspecified ovary: Secondary | ICD-10-CM | POA: Diagnosis not present

## 2019-04-21 DIAGNOSIS — Z20822 Contact with and (suspected) exposure to covid-19: Secondary | ICD-10-CM | POA: Diagnosis not present

## 2019-04-21 DIAGNOSIS — Z01812 Encounter for preprocedural laboratory examination: Secondary | ICD-10-CM | POA: Diagnosis not present

## 2019-04-25 DIAGNOSIS — Z885 Allergy status to narcotic agent status: Secondary | ICD-10-CM | POA: Diagnosis not present

## 2019-04-25 DIAGNOSIS — R1909 Other intra-abdominal and pelvic swelling, mass and lump: Secondary | ICD-10-CM | POA: Diagnosis not present

## 2019-04-25 DIAGNOSIS — N736 Female pelvic peritoneal adhesions (postinfective): Secondary | ICD-10-CM | POA: Diagnosis not present

## 2019-04-25 DIAGNOSIS — N939 Abnormal uterine and vaginal bleeding, unspecified: Secondary | ICD-10-CM | POA: Insufficient documentation

## 2019-04-25 DIAGNOSIS — H9313 Tinnitus, bilateral: Secondary | ICD-10-CM | POA: Diagnosis not present

## 2019-04-25 DIAGNOSIS — N920 Excessive and frequent menstruation with regular cycle: Secondary | ICD-10-CM | POA: Diagnosis not present

## 2019-04-25 DIAGNOSIS — M1990 Unspecified osteoarthritis, unspecified site: Secondary | ICD-10-CM | POA: Diagnosis not present

## 2019-04-25 DIAGNOSIS — N83292 Other ovarian cyst, left side: Secondary | ICD-10-CM | POA: Diagnosis not present

## 2019-04-25 DIAGNOSIS — N83202 Unspecified ovarian cyst, left side: Secondary | ICD-10-CM | POA: Diagnosis not present

## 2019-04-25 DIAGNOSIS — N9489 Other specified conditions associated with female genital organs and menstrual cycle: Secondary | ICD-10-CM | POA: Insufficient documentation

## 2019-04-30 DIAGNOSIS — R300 Dysuria: Secondary | ICD-10-CM | POA: Diagnosis not present

## 2019-04-30 DIAGNOSIS — R3914 Feeling of incomplete bladder emptying: Secondary | ICD-10-CM | POA: Diagnosis not present

## 2019-06-05 DIAGNOSIS — Z6830 Body mass index (BMI) 30.0-30.9, adult: Secondary | ICD-10-CM | POA: Diagnosis not present

## 2019-06-05 DIAGNOSIS — Z13220 Encounter for screening for lipoid disorders: Secondary | ICD-10-CM | POA: Diagnosis not present

## 2019-06-05 DIAGNOSIS — Z124 Encounter for screening for malignant neoplasm of cervix: Secondary | ICD-10-CM | POA: Diagnosis not present

## 2019-06-05 DIAGNOSIS — Z01419 Encounter for gynecological examination (general) (routine) without abnormal findings: Secondary | ICD-10-CM | POA: Diagnosis not present

## 2019-06-05 DIAGNOSIS — Z0000 Encounter for general adult medical examination without abnormal findings: Secondary | ICD-10-CM | POA: Diagnosis not present

## 2019-06-06 DIAGNOSIS — Z1231 Encounter for screening mammogram for malignant neoplasm of breast: Secondary | ICD-10-CM | POA: Diagnosis not present

## 2019-06-06 DIAGNOSIS — Z124 Encounter for screening for malignant neoplasm of cervix: Secondary | ICD-10-CM | POA: Diagnosis not present

## 2019-06-06 LAB — HM MAMMOGRAPHY

## 2019-06-12 ENCOUNTER — Ambulatory Visit: Payer: BC Managed Care – PPO | Admitting: Sports Medicine

## 2019-06-12 LAB — HM PAP SMEAR: HM Pap smear: NEGATIVE

## 2019-06-17 ENCOUNTER — Encounter: Payer: Self-pay | Admitting: Family Medicine

## 2019-06-17 NOTE — Progress Notes (Unsigned)
Negative for intraepithelial lesion or malignancy.  

## 2019-06-19 ENCOUNTER — Encounter: Payer: Self-pay | Admitting: Sports Medicine

## 2019-06-19 ENCOUNTER — Ambulatory Visit (INDEPENDENT_AMBULATORY_CARE_PROVIDER_SITE_OTHER): Payer: BC Managed Care – PPO | Admitting: Sports Medicine

## 2019-06-19 DIAGNOSIS — M5136 Other intervertebral disc degeneration, lumbar region: Secondary | ICD-10-CM | POA: Diagnosis not present

## 2019-06-19 DIAGNOSIS — M51369 Other intervertebral disc degeneration, lumbar region without mention of lumbar back pain or lower extremity pain: Secondary | ICD-10-CM

## 2019-06-19 MED ORDER — PREDNISONE 50 MG PO TABS: ORAL_TABLET | ORAL | 0 refills | Status: DC

## 2019-06-19 MED ORDER — GABAPENTIN 300 MG PO CAPS: ORAL_CAPSULE | ORAL | 3 refills | Status: DC

## 2019-06-19 NOTE — Progress Notes (Signed)
    Procedures performed today:    None.  Independent interpretation of notes and tests performed by another provider:   None.  Brief History, Exam, Impression, and Recommendations:    Lumbar degenerative disc disease I think Sheryl Taylor is having a flare of her lumbar degenerative disc disease, she is having bilateral hip girdle pain, with radiation down the left leg. No bowel or bladder dysfunction or other red flags, she does desire a cost conscious approach. We will start with a 5-day burst of prednisone, she will hold off on ibuprofen and restart when the prednisone is done, I am also going to add gabapentin, we discussed the mechanism and adverse effects. I would like to see her back in about 4 weeks, and we will consider MRI of the lumbar spine if no better. There was diffuse hyperesthesia with light palpation on her hip girdle, back, legs all consistent with a concurrent myofascial pain syndrome.    ___________________________________________ Ihor Austin. Benjamin Stain, M.D., ABFM., CAQSM. Primary Care and Sports Medicine [facility]  Adjunct Instructor of Family Medicine  [employer]

## 2019-06-19 NOTE — Assessment & Plan Note (Signed)
I think Sheryl Taylor is having a flare of her lumbar degenerative disc disease, she is having bilateral hip girdle pain, with radiation down the left leg. No bowel or bladder dysfunction or other red flags, she does desire a cost conscious approach. We will start with a 5-day burst of prednisone, she will hold off on ibuprofen and restart when the prednisone is done, I am also going to add gabapentin, we discussed the mechanism and adverse effects. I would like to see her back in about 4 weeks, and we will consider MRI of the lumbar spine if no better. There was diffuse hyperesthesia with light palpation on her hip girdle, back, legs all consistent with a concurrent myofascial pain syndrome.

## 2019-07-19 ENCOUNTER — Ambulatory Visit: Payer: BC Managed Care – PPO | Admitting: Sports Medicine

## 2019-07-26 ENCOUNTER — Ambulatory Visit: Payer: BC Managed Care – PPO | Admitting: Sports Medicine

## 2019-07-26 ENCOUNTER — Other Ambulatory Visit: Payer: Self-pay

## 2019-07-26 DIAGNOSIS — M5136 Other intervertebral disc degeneration, lumbar region: Secondary | ICD-10-CM | POA: Diagnosis not present

## 2019-07-26 DIAGNOSIS — M25511 Pain in right shoulder: Secondary | ICD-10-CM | POA: Diagnosis not present

## 2019-07-26 DIAGNOSIS — G8929 Other chronic pain: Secondary | ICD-10-CM | POA: Diagnosis not present

## 2019-07-26 DIAGNOSIS — M51369 Other intervertebral disc degeneration, lumbar region without mention of lumbar back pain or lower extremity pain: Secondary | ICD-10-CM

## 2019-07-26 DIAGNOSIS — R2242 Localized swelling, mass and lump, left lower limb: Secondary | ICD-10-CM | POA: Diagnosis not present

## 2019-07-26 NOTE — Progress Notes (Signed)
    Procedures performed today:    None.  Independent interpretation of notes and tests performed by another provider:   None.  Brief History, Exam, Impression, and Recommendations:    Right shoulder pain Nansi returns, she has impingement type symptoms, we did a subacromial injection approximately 7 months ago and she did extremely well until recently, now having a recurrence of pain, I am going to get her back diligent with the rehab exercises before considering an additional injection.  Lumbar degenerative disc disease There is also lumbar degenerative disc disease, she also has bilateral hip girdle pain, with allodynia and hyperalgesia all consistent with concurrent myofascial pain syndrome. She did really well with prednisone, rehab exercises, only has intermittent symptoms, she feels as though she can live with these. We did gabapentin at the last visit, she feels as though it constipated her after a single dose and discontinued it. At this point she is just going to live with it, we could certainly start Lyrica if left leg aching worsens.  Mass of ankle, left She has also had a chronic fullness over her anterolateral ankle, on exam this feels to be a lipoma. She has no pain, there is simply some asymmetry with the contralateral side. I offered MRI with IV contrast for characterization but she does prefer cost conscious approach. At this point we are going to simply leave it alone, should she desire intervention we could do an intralesional steroid injection in the hopes of some fatty atrophy to bring it symmetric with the contralateral side. No further intervention today.    ___________________________________________ Ihor Austin. Benjamin Stain, M.D., ABFM., CAQSM. Primary Care and Sports Medicine [facility]  Adjunct Instructor of Family Medicine  [employer]

## 2019-07-26 NOTE — Assessment & Plan Note (Signed)
There is also lumbar degenerative disc disease, she also has bilateral hip girdle pain, with allodynia and hyperalgesia all consistent with concurrent myofascial pain syndrome. She did really well with prednisone, rehab exercises, only has intermittent symptoms, she feels as though she can live with these. We did gabapentin at the last visit, she feels as though it constipated her after a single dose and discontinued it. At this point she is just going to live with it, we could certainly start Lyrica if left leg aching worsens.

## 2019-07-26 NOTE — Assessment & Plan Note (Signed)
Sheryl Taylor returns, she has impingement type symptoms, we did a subacromial injection approximately 7 months ago and she did extremely well until recently, now having a recurrence of pain, I am going to get her back diligent with the rehab exercises before considering an additional injection.

## 2019-07-26 NOTE — Patient Instructions (Signed)
Lipoma  A lipoma is a noncancerous (benign) tumor that is made up of fat cells. This is a very common type of soft-tissue growth. Lipomas are usually found under the skin (subcutaneous). They may occur in any tissue of the body that contains fat. Common areas for lipomas to appear include the back, arms, shoulders, buttocks, and thighs. Lipomas grow slowly, and they are usually painless. Most lipomas do not cause problems and do not require treatment. What are the causes? The cause of this condition is not known. What increases the risk? You are more likely to develop this condition if:  You are 40-60 years old.  You have a family history of lipomas. What are the signs or symptoms? A lipoma usually appears as a small, round bump under the skin. In most cases, the lump will:  Feel soft or rubbery.  Not cause pain or other symptoms. However, if a lipoma is located in an area where it pushes on nerves, it can become painful or cause other symptoms. How is this diagnosed? A lipoma can usually be diagnosed with a physical exam. You may also have tests to confirm the diagnosis and to rule out other conditions. Tests may include:  Imaging tests, such as a CT scan or an MRI.  Removal of a tissue sample to be looked at under a microscope (biopsy). How is this treated? Treatment for this condition depends on the size of the lipoma and whether it is causing any symptoms.  For small lipomas that are not causing problems, no treatment is needed.  If a lipoma is bigger or it causes problems, surgery may be done to remove the lipoma. Lipomas can also be removed to improve appearance. Most often, the procedure is done after applying a medicine that numbs the area (local anesthetic).  Liposuction may be done to reduce the size of the lipoma before it is removed through surgery, or it may be done to remove the lipoma. Lipomas are removed with this method in order to limit incision size and scarring. A  liposuction tube is inserted through a small incision into the lipoma, and the contents of the lipoma are removed through the tube with suction. Follow these instructions at home:  Watch your lipoma for any changes.  Keep all follow-up visits as told by your health care provider. This is important. Contact a health care provider if:  Your lipoma becomes larger or hard.  Your lipoma becomes painful, red, or increasingly swollen. These could be signs of infection or a more serious condition. Get help right away if:  You develop tingling or numbness in an area near the lipoma. This could indicate that the lipoma is causing nerve damage. Summary  A lipoma is a noncancerous tumor that is made up of fat cells.  Most lipomas do not cause problems and do not require treatment.  If a lipoma is bigger or it causes problems, surgery may be done to remove the lipoma.  Contact a health care provider if your lipoma becomes larger or hard, or if it becomes painful, red, or increasingly swollen. Pain, redness, and swelling could be signs of infection or a more serious condition. This information is not intended to replace advice given to you by your health care provider. Make sure you discuss any questions you have with your health care provider. Document Revised: 08/13/2018 Document Reviewed: 08/13/2018 Elsevier Patient Education  2020 Elsevier Inc.  

## 2019-07-26 NOTE — Assessment & Plan Note (Signed)
She has also had a chronic fullness over her anterolateral ankle, on exam this feels to be a lipoma. She has no pain, there is simply some asymmetry with the contralateral side. I offered MRI with IV contrast for characterization but she does prefer cost conscious approach. At this point we are going to simply leave it alone, should she desire intervention we could do an intralesional steroid injection in the hopes of some fatty atrophy to bring it symmetric with the contralateral side. No further intervention today.

## 2019-08-05 ENCOUNTER — Telehealth: Payer: Self-pay | Admitting: *Deleted

## 2019-08-05 DIAGNOSIS — M5136 Other intervertebral disc degeneration, lumbar region: Secondary | ICD-10-CM

## 2019-08-05 DIAGNOSIS — R2242 Localized swelling, mass and lump, left lower limb: Secondary | ICD-10-CM

## 2019-08-05 DIAGNOSIS — M51369 Other intervertebral disc degeneration, lumbar region without mention of lumbar back pain or lower extremity pain: Secondary | ICD-10-CM

## 2019-08-05 NOTE — Telephone Encounter (Signed)
Orders placed.

## 2019-08-05 NOTE — Telephone Encounter (Signed)
Pt called today and is ready for you to put the orders in for her lumbar and ankle mris.

## 2019-08-12 ENCOUNTER — Encounter: Payer: Self-pay | Admitting: Family Medicine

## 2019-08-12 ENCOUNTER — Ambulatory Visit (INDEPENDENT_AMBULATORY_CARE_PROVIDER_SITE_OTHER): Payer: BC Managed Care – PPO | Admitting: Family Medicine

## 2019-08-12 ENCOUNTER — Other Ambulatory Visit: Payer: Self-pay

## 2019-08-12 VITALS — BP 103/64 | HR 75 | Temp 98.0°F | Ht 62.0 in | Wt 168.0 lb

## 2019-08-12 DIAGNOSIS — Z1159 Encounter for screening for other viral diseases: Secondary | ICD-10-CM | POA: Diagnosis not present

## 2019-08-12 DIAGNOSIS — Z23 Encounter for immunization: Secondary | ICD-10-CM

## 2019-08-12 DIAGNOSIS — Z0000 Encounter for general adult medical examination without abnormal findings: Secondary | ICD-10-CM | POA: Diagnosis not present

## 2019-08-12 NOTE — Progress Notes (Signed)
Subjective:     Sheryl Taylor is a 49 y.o. female and is here for a comprehensive physical exam. The patient reports no problems.  She is doing well overall.  She ended up having her left ovary removed because she had a growth on it.  Fortunately it came back benign she also had an endometrial ablation for heavy periods and has been happy with the results.  She had the fallopian tubes removed but she still has her right ovary.  From the E note she is scheduled for an MRI for her back tomorrow with Dr. Dianah Field.  He has been wanting to get an MRI for a while but she started having more frequent back pain and she is already met her deductible this year.  Social History   Socioeconomic History  . Marital status: Married    Spouse name: Frederico Hamman   . Number of children: 2  . Years of education: Not on file  . Highest education level: Not on file  Occupational History  . Occupation: Control and instrumentation engineer    Comment: [employer]  Tobacco Use  . Smoking status: Never Smoker  . Smokeless tobacco: Never Used  Substance and Sexual Activity  . Alcohol use: No    Alcohol/week: 0.0 standard drinks  . Drug use: No  . Sexual activity: Yes    Partners: Male  Other Topics Concern  . 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 And has two children. She exercises 2 days a wk for an 1 hour.  Her sister died suddenty at 80 unknown cause and this makes her very anxious.     Social Determinants of Health   Financial Resource Strain:   . Difficulty of Paying Living Expenses:   Food Insecurity:   . Worried About Charity fundraiser in the Last Year:   . Arboriculturist in the Last Year:   Transportation Needs:   . Film/video editor (Medical):   [name] Lack of Transportation (Non-Medical):   Physical Activity:   . Days of Exercise per Week:   . Minutes of Exercise per Session:   Stress:   . Feeling of Stress :   Social Connections:    . Frequency of Communication with Friends and Family:   . Frequency of Social Gatherings with Friends and Family:   . Attends Religious Services:   . Active Member of Clubs or Organizations:   . Attends Archivist Meetings:   [name] Marital Status:   Intimate Partner Violence:   . Fear of Current or Ex-Partner:   . Emotionally Abused:   [name] Physically Abused:   . Sexually Abused:    Health Maintenance  Topic Date Due  . Hepatitis C Screening  Never done  . COVID-19 Vaccine (1) 08/28/2019 (Originally 07/16/1982)  . INFLUENZA VACCINE  01/10/2024 (Originally 08/11/2019)  . MAMMOGRAM  06/05/2020  . PAP SMEAR-Modifier  06/11/2024  . TETANUS/TDAP  08/11/2029  . HIV Screening  Completed    The following portions of the patient's history were reviewed and updated as appropriate: allergies, current medications, past family history, past medical history, past social history, past surgical history and problem list.  Review of Systems A comprehensive review of systems was negative.   Objective:    BP (!) 103/64 (BP Location: Left Arm, Patient Position: Sitting)   Pulse 75   Temp 98 F (36.7 C)   Ht '5\' 2"'  (1.575  m)   Wt 168 lb (76.2 kg)   SpO2 97%   BMI 30.73 kg/m  General appearance: alert, cooperative and appears stated age Head: Normocephalic, without obvious abnormality, atraumatic Eyes: conj clera, EOMi, PEERLA Ears: normal TM's and external ear canals both ears Nose: Nares normal. Septum midline. Mucosa normal. No drainage or sinus tenderness. Throat: lips, mucosa, and tongue normal; teeth and gums normal Neck: no adenopathy, no carotid bruit, no JVD, supple, symmetrical, trachea midline and thyroid not enlarged, symmetric, no tenderness/mass/nodules Back: symmetric, no curvature. ROM normal. No CVA tenderness. Lungs: clear to auscultation bilaterally Heart: regular rate and rhythm, S1, S2 normal, no murmur, click, rub or gallop Abdomen: soft, non-tender; bowel sounds  normal; no masses,  no organomegaly Extremities: extremities normal, atraumatic, no cyanosis or edema Pulses: 2+ and symmetric Skin: Skin color, texture, turgor normal. No rashes or lesions Lymph nodes: Cervical adenopathy: nl and Supraclavicular adenopathy: nl Neurologic: Alert and oriented X 3, normal strength and tone. Normal symmetric reflexes. Normal coordination and gait    Assessment:    Healthy female exam.      Plan:     See After Visit Summary for Counseling Recommendations   Keep up a regular exercise program and make sure you are eating a healthy diet Try to eat 4 servings of dairy a day, or if you are lactose intolerant take a calcium with vitamin D daily.  Your vaccines are up to date.  Gust need for colon cancer screening.  She declines this year but says she will consider it for next summer.  Tdap given today. Ollen Barges is up-to-date.

## 2019-08-13 ENCOUNTER — Encounter: Payer: Self-pay | Admitting: Family Medicine

## 2019-08-13 ENCOUNTER — Ambulatory Visit (INDEPENDENT_AMBULATORY_CARE_PROVIDER_SITE_OTHER): Payer: BC Managed Care – PPO

## 2019-08-13 DIAGNOSIS — M5136 Other intervertebral disc degeneration, lumbar region: Secondary | ICD-10-CM | POA: Diagnosis not present

## 2019-08-13 DIAGNOSIS — R2242 Localized swelling, mass and lump, left lower limb: Secondary | ICD-10-CM

## 2019-08-13 DIAGNOSIS — M545 Low back pain: Secondary | ICD-10-CM | POA: Diagnosis not present

## 2019-08-13 LAB — TSH: TSH: 3.59 mIU/L

## 2019-08-13 LAB — CBC
HCT: 38.4 % (ref 35.0–45.0)
Hemoglobin: 12.1 g/dL (ref 11.7–15.5)
MCH: 26.5 pg — ABNORMAL LOW (ref 27.0–33.0)
MCHC: 31.5 g/dL — ABNORMAL LOW (ref 32.0–36.0)
MCV: 84.2 fL (ref 80.0–100.0)
MPV: 10.7 fL (ref 7.5–12.5)
Platelets: 349 10*3/uL (ref 140–400)
RBC: 4.56 10*6/uL (ref 3.80–5.10)
RDW: 13.8 % (ref 11.0–15.0)
WBC: 5.8 10*3/uL (ref 3.8–10.8)

## 2019-08-13 LAB — COMPLETE METABOLIC PANEL WITH GFR
AG Ratio: 1.5 (calc) (ref 1.0–2.5)
ALT: 9 U/L (ref 6–29)
AST: 14 U/L (ref 10–35)
Albumin: 4 g/dL (ref 3.6–5.1)
Alkaline phosphatase (APISO): 84 U/L (ref 31–125)
BUN: 12 mg/dL (ref 7–25)
CO2: 29 mmol/L (ref 20–32)
Calcium: 9.1 mg/dL (ref 8.6–10.2)
Chloride: 108 mmol/L (ref 98–110)
Creat: 0.98 mg/dL (ref 0.50–1.10)
GFR, Est African American: 80 mL/min/{1.73_m2} (ref >=60)
GFR, Est Non African American: 69 mL/min/{1.73_m2} (ref >=60)
Globulin: 2.6 g/dL (calc) (ref 1.9–3.7)
Glucose, Bld: 102 mg/dL — ABNORMAL HIGH (ref 65–99)
Potassium: 4.5 mmol/L (ref 3.5–5.3)
Sodium: 141 mmol/L (ref 135–146)
Total Bilirubin: 0.4 mg/dL (ref 0.2–1.2)
Total Protein: 6.6 g/dL (ref 6.1–8.1)

## 2019-08-13 LAB — LIPID PANEL
Cholesterol: 185 mg/dL (ref <200)
HDL: 51 mg/dL (ref >=50)
LDL Cholesterol (Calc): 116 mg/dL (calc) — ABNORMAL HIGH
Non-HDL Cholesterol (Calc): 134 mg/dL (calc) — ABNORMAL HIGH (ref <130)
Total CHOL/HDL Ratio: 3.6 (calc) (ref <5.0)
Triglycerides: 79 mg/dL (ref <150)

## 2019-08-13 LAB — HEPATITIS C ANTIBODY
Hepatitis C Ab: NONREACTIVE
SIGNAL TO CUT-OFF: 0.01 (ref <1.00)

## 2019-08-13 MED ORDER — GADOBUTROL 1 MMOL/ML IV SOLN
7.5000 mL | Freq: Once | INTRAVENOUS | Status: AC | PRN
Start: 2019-08-13 — End: 2019-08-13
  Administered 2019-08-13: 7.5 mL via INTRAVENOUS

## 2019-08-13 MED ORDER — GADOBUTROL 1 MMOL/ML IV SOLN
7.5000 mL | Freq: Once | INTRAVENOUS | Status: DC | PRN
Start: 2019-08-13 — End: 2019-08-13

## 2019-09-09 ENCOUNTER — Ambulatory Visit (INDEPENDENT_AMBULATORY_CARE_PROVIDER_SITE_OTHER): Payer: BC Managed Care – PPO | Admitting: Sports Medicine

## 2019-09-09 ENCOUNTER — Other Ambulatory Visit: Payer: Self-pay

## 2019-09-09 ENCOUNTER — Ambulatory Visit (INDEPENDENT_AMBULATORY_CARE_PROVIDER_SITE_OTHER): Payer: BC Managed Care – PPO

## 2019-09-09 DIAGNOSIS — M25511 Pain in right shoulder: Secondary | ICD-10-CM | POA: Diagnosis not present

## 2019-09-09 DIAGNOSIS — R2242 Localized swelling, mass and lump, left lower limb: Secondary | ICD-10-CM

## 2019-09-09 DIAGNOSIS — G8929 Other chronic pain: Secondary | ICD-10-CM | POA: Diagnosis not present

## 2019-09-09 NOTE — Assessment & Plan Note (Signed)
There is a lipoma at the left ankle just anterior to the tip of the lateral malleolus, this was confirmed with MRI. Today we did an intralesional injection with steroid in the hopes of shrinking this down. Return to see me in a month. Images were obtained of the lesion today, we can compare this to how it looks in a month.

## 2019-09-09 NOTE — Assessment & Plan Note (Signed)
Sheryl Taylor has recurrence of impingement type symptoms, we last injection about 8 months ago, she has been diligent with rehab, repeat right subacromial injection today, return in a month, MRI if no better.

## 2019-09-09 NOTE — Progress Notes (Addendum)
    Procedures performed today:    Procedure: Real-time Ultrasound Guided injection of the right subacromial bursa Device: Samsung HS60  Verbal informed consent obtained.  Time-out conducted.  Noted no overlying erythema, induration, or other signs of local infection.  Skin prepped in a sterile fashion.  Local anesthesia: Topical Ethyl chloride.  With sterile technique and under real time ultrasound guidance: 1 cc Kenalog 40, 1 cc lidocaine, 1 cc bupivacaine injected easily Completed without difficulty  Advised to call if fevers/chills, erythema, induration, drainage, or persistent bleeding.  Images permanently stored and available for review in the ultrasound unit.  Impression: Technically successful ultrasound guided injection.  Procedure: Real-time Ultrasound Guided intralesional injection of the left ankle subcutaneous lipoma Device: Samsung HS60  Verbal informed consent obtained.  Time-out conducted.  Noted no overlying erythema, induration, or other signs of local infection.  Skin prepped in a sterile fashion.  Local anesthesia: Topical Ethyl chloride.  With sterile technique and under real time ultrasound guidance: 2 cc Kenalog 40, 1 cc lidocaine in a fanlike pattern into the lipoma. Completed without difficulty  Advised to call if fevers/chills, erythema, induration, drainage, or persistent bleeding.  Images permanently stored and available for review in the ultrasound unit.  Impression: Technically successful ultrasound guided injection.  ___________________________________________ Ihor Austin. Benjamin Stain, M.D., ABFM., CAQSM. Primary Care and Sports Medicine [place] [facility]   Adjunct Instructor of Family Medicine  [employer]   Independent interpretation of notes and tests performed by another provider:   None.  Brief History, Exam, Impression, and Recommendations:      Mass of ankle, left There is a lipoma  at the left ankle just anterior to the tip of the lateral malleolus, this was confirmed with MRI. Today we did an intralesional injection with steroid in the hopes of shrinking this down. Return to see me in a month. Images were obtained of the lesion today, we can compare this to how it looks in a month.  Right shoulder pain Chip Boer has recurrence of impingement type symptoms, we last injection about 8 months ago, she has been diligent with rehab, repeat right subacromial injection today, return in a month, MRI if no better.    ___________________________________________ Ihor Austin. Benjamin Stain, M.D., ABFM., CAQSM. Primary Care and Sports Medicine [place] [facility]  Adjunct Instructor of Family Medicine  [employer]

## 2019-10-09 ENCOUNTER — Ambulatory Visit (INDEPENDENT_AMBULATORY_CARE_PROVIDER_SITE_OTHER): Payer: BC Managed Care – PPO | Admitting: Sports Medicine

## 2019-10-09 ENCOUNTER — Other Ambulatory Visit: Payer: Self-pay

## 2019-10-09 DIAGNOSIS — M25511 Pain in right shoulder: Secondary | ICD-10-CM

## 2019-10-09 DIAGNOSIS — G8929 Other chronic pain: Secondary | ICD-10-CM | POA: Diagnosis not present

## 2019-10-09 DIAGNOSIS — R2242 Localized swelling, mass and lump, left lower limb: Secondary | ICD-10-CM

## 2019-10-09 NOTE — Assessment & Plan Note (Signed)
Sheryl Taylor returns, she had a mass on her left lateral ankle just anterior to the tip of the lateral malleolus, we obtained an MRI with and without contrast that confirmed a lipoma. I tried an intralesional injection with steroid in the hopes of shrinking this down, she returns with no improvement in her symptoms at all, it does interfere with her activities of daily living, it keeps her from putting on shoes that she likes, and I would like her to touch base with plastic surgery.

## 2019-10-09 NOTE — Progress Notes (Signed)
    Procedures performed today:    None.  Independent interpretation of notes and tests performed by another provider:   None.  Brief History, Exam, Impression, and Recommendations:    Mass of ankle, left Sheryl Taylor returns, she had a mass on her left lateral ankle just anterior to the tip of the lateral malleolus, we obtained an MRI with and without contrast that confirmed a lipoma. I tried an intralesional injection with steroid in the hopes of shrinking this down, she returns with no improvement in her symptoms at all, it does interfere with her activities of daily living, it keeps her from putting on shoes that she likes, and I would like her to touch base with plastic surgery.   Right shoulder pain Sheryl Taylor continues to have impingement type symptoms, we injected her at the last visit, subacromial, and she returns today pain-free. If persistence of discomfort or recurrence we can do a MRI.    ___________________________________________ Ihor Austin. Benjamin Stain, M.D., ABFM., CAQSM. Primary Care and Sports Medicine [place] [facility]  Adjunct Instructor of Family Medicine  [employer] [facility] [employer]

## 2019-10-09 NOTE — Assessment & Plan Note (Signed)
Sheryl Taylor continues to have impingement type symptoms, we injected her at the last visit, subacromial, and she returns today pain-free. If persistence of discomfort or recurrence we can do a MRI.

## 2019-11-22 DIAGNOSIS — D1724 Benign lipomatous neoplasm of skin and subcutaneous tissue of left leg: Secondary | ICD-10-CM | POA: Diagnosis not present

## 2019-12-27 DIAGNOSIS — D1724 Benign lipomatous neoplasm of skin and subcutaneous tissue of left leg: Secondary | ICD-10-CM | POA: Diagnosis not present

## 2019-12-30 ENCOUNTER — Telehealth: Payer: Self-pay | Admitting: Family Medicine

## 2019-12-30 ENCOUNTER — Ambulatory Visit: Payer: BC Managed Care – PPO | Admitting: Family Medicine

## 2019-12-30 NOTE — Telephone Encounter (Signed)
Patient had ankle surgery on Friday and needed to cancel her 2 PM appointment for today. Did not want to reschedule at this time. AM

## 2020-01-07 ENCOUNTER — Ambulatory Visit: Payer: BC Managed Care – PPO | Admitting: Sports Medicine

## 2020-01-13 ENCOUNTER — Other Ambulatory Visit: Payer: Self-pay

## 2020-01-13 ENCOUNTER — Ambulatory Visit (INDEPENDENT_AMBULATORY_CARE_PROVIDER_SITE_OTHER): Payer: Managed Care, Other (non HMO) | Admitting: Family Medicine

## 2020-01-13 ENCOUNTER — Encounter: Payer: Self-pay | Admitting: Family Medicine

## 2020-01-13 VITALS — BP 112/56 | HR 85 | Wt 164.1 lb

## 2020-01-13 DIAGNOSIS — T8189XA Other complications of procedures, not elsewhere classified, initial encounter: Secondary | ICD-10-CM | POA: Diagnosis not present

## 2020-01-13 DIAGNOSIS — H6981 Other specified disorders of Eustachian tube, right ear: Secondary | ICD-10-CM

## 2020-01-13 MED ORDER — DOXYCYCLINE HYCLATE 100 MG PO TABS: 100.0000 mg | ORAL_TABLET | Freq: Two times a day (BID) | ORAL | 0 refills | Status: DC

## 2020-01-13 MED ORDER — PREDNISONE 20 MG PO TABS: 40.0000 mg | ORAL_TABLET | Freq: Every day | ORAL | 0 refills | Status: DC

## 2020-01-13 NOTE — Progress Notes (Signed)
Established Patient Office Visit  Subjective:  Patient ID: Sheryl Taylor, female    DOB: April 21, 1970  Age: 50 y.o. MRN: [id number]  CC: No chief complaint on file.   HPI Sheryl Taylor presents for follow-up of ankle pain since 3 weeks status post surgery for removal of a lipoma from the left lateral ankle.  She actually went an early for an appointment to have them checked the area and she had formed a blister underneath the dressing.  They went ahead and remove the Steri-Strips at the time but left the sutures in infectious post follow-up on Friday to have the sutures removed.  She says is just been extremely painful its been keeping her awake at night she has had a little bit of bloody drainage.  She has been putting an antibiotic ointment on it that she was given in their office.  She wants me to check her right ear today she is worried about a possible ear infection.  Been feeling a lot of popping and cracking in the ear and a lot of pressure and fullness.  No other upper respiratory symptoms.  Does like her symptoms have been coming and going.  Past Medical History:  Diagnosis Date  . Asthma   . Bronchitis   . Hyperlipidemia   . Thyroid nodule     Past Surgical History:  Procedure Laterality Date  . CESAREAN SECTION     x 2   . CHOLECYSTECTOMY  2003    Family History  Problem Relation Age of Onset  . Lung cancer Father        lung (62)  . Heart failure Father        (67)  . Hyperlipidemia Father   . CAD Father   . Breast cancer Unknown        great aunt  . Breast cancer Unknown        cousin  . Heart attack Paternal Uncle        bypass surgery   . Breast cancer Maternal Aunt     Social History   Socioeconomic History  . Marital status: Married    Spouse name: Frederico Hamman   . Number of children: 2  . Years of education: Not on file  . Highest education level: Not on file  Occupational History  . Occupation: Control and instrumentation engineer    Comment: [employer]  Tobacco Use  .  Smoking status: Never Smoker  . Smokeless tobacco: Never Used  Substance and Sexual Activity  . Alcohol use: No    Alcohol/week: 0.0 standard drinks  . Drug use: No  . Sexual activity: Yes    Partners: Male  Other Topics Concern  . 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 And has two children. She exercises 2 days a wk for an 1 hour.  Her sister died suddenty at 3 unknown cause and this makes her very anxious.     Social Determinants of Health   Financial Resource Strain: Not on file  Food Insecurity: Not on file  Transportation Needs: Not on file  Physical Activity: Not on file  Stress: Not on file  Social Connections: Not on file  Intimate Partner Violence: Not on file    Outpatient Medications Prior to Visit  Medication Sig Dispense Refill  . cetirizine (ZYRTEC) 10 MG tablet Take 10 mg by mouth daily.    . fluticasone (FLONASE) 50 MCG/ACT nasal spray Place  into the nose.    . ibuprofen (ADVIL) 800 MG tablet Take 1 tablet (800 mg total) by mouth every 8 (eight) hours as needed. 30 tablet 2  . PREVIDENT 5000 BOOSTER PLUS 1.1 % PSTE USE ONCE DAILY IN PLACE OF REGULAR TOOTHPASTE     No facility-administered medications prior to visit.    Allergies  Allergen Reactions  . Codeine Phosphate Itching  . Buprenorphine Hcl Rash and Swelling  . Morphine And Related Swelling and Rash    ROS Review of Systems    Objective:    Physical Exam Constitutional:      Appearance: She is well-developed.  HENT:     Head: Normocephalic and atraumatic.     Right Ear: Tympanic membrane, ear canal and external ear normal.     Left Ear: Tympanic membrane, ear canal and external ear normal.     Nose: Nose normal.     Mouth/Throat:     Mouth: Mucous membranes are moist.     Pharynx: Oropharynx is clear. No oropharyngeal exudate or posterior oropharyngeal erythema.  Eyes:     Conjunctiva/sclera: Conjunctivae  normal.  Neck:     Thyroid: No thyromegaly.  Pulmonary:     Effort: Pulmonary effort is normal.     Breath sounds: No wheezing.  Musculoskeletal:     Cervical back: Neck supple.  Lymphadenopathy:     Cervical: No cervical adenopathy.  Skin:    General: Skin is warm and dry.     Comments: Wound on the left ankle with an approximately 3 cm oval-shaped wound with some necrotic tissue in the center that is already hardened.  Erythema around the borders.  Sutures in place.  Just some bloody drainage on the bandage.  Neurological:     Mental Status: She is alert and oriented to person, place, and time.     BP (!) 112/56   Pulse 85   Wt 164 lb 1.9 oz (74.4 kg)   SpO2 97%   BMI 30.02 kg/m  Wt Readings from Last 3 Encounters:  01/13/20 164 lb 1.9 oz (74.4 kg)  08/12/19 168 lb (76.2 kg)  02/12/19 171 lb (77.6 kg)     Health Maintenance Due  Topic Date Due  . COLONOSCOPY (Pts 45-10yrs Insurance coverage will need to be confirmed)  Never done    There are no preventive care reminders to display for this patient.  Lab Results  Component Value Date   TSH 3.59 08/12/2019   Lab Results  Component Value Date   WBC 5.8 08/12/2019   HGB 12.1 08/12/2019   HCT 38.4 08/12/2019   MCV 84.2 08/12/2019   PLT 349 08/12/2019   Lab Results  Component Value Date   NA 141 08/12/2019   K 4.5 08/12/2019   CO2 29 08/12/2019   GLUCOSE 102 (H) 08/12/2019   BUN 12 08/12/2019   CREATININE 0.98 08/12/2019   BILITOT 0.4 08/12/2019   ALKPHOS 79 07/29/2016   AST 14 08/12/2019   ALT 9 08/12/2019   PROT 6.6 08/12/2019   ALBUMIN 4.0 07/29/2016   CALCIUM 9.1 08/12/2019   Lab Results  Component Value Date   CHOL 185 08/12/2019   Lab Results  Component Value Date   HDL 51 08/12/2019   Lab Results  Component Value Date   LDLCALC 116 (H) 08/12/2019   Lab Results  Component Value Date   TRIG 79 08/12/2019   Lab Results  Component Value Date   CHOLHDL 3.6 08/12/2019   No results  found for: HGBA1C    Assessment & Plan:   Problem List Items Addressed This Visit   None   Visit Diagnoses    Dysfunction of right eustachian tube    -  Primary   Relevant Medications   predniSONE (DELTASONE) 20 MG tablet   Non-healing surgical wound, initial encounter       Relevant Medications   doxycycline (VIBRA-TABS) 100 MG tablet     Eustachian dysfunction, right-she is already been using a nasal spray red spray and an oral antihistamine regularly.  We will do a trial of oral prednisone.  If not improving please let me know.  Nonhealing surgical wound-she does have some significant erythema around the border.  Just little bit of bloody discharge on the bandage no foul odor.  She unfortunately has some necrotic tissue likely where the blister was previously based on photographs that she showed me during the visit.  Continue with topical antibiotic ointment and keeping it well moisturized encouraged her not to let it dry out.  Keep it elevated and use an Ace wrap for compression.  Also will start oral antibiotic.  Have her follow-up in 1 week.  She will see the plastic surgeon again on Friday.  And I will see her next week to make sure that it is healing and for debridement if needed at that time.  Meds ordered this encounter  Medications  . doxycycline (VIBRA-TABS) 100 MG tablet    Sig: Take 1 tablet (100 mg total) by mouth 2 (two) times daily.    Dispense:  20 tablet    Refill:  0  . predniSONE (DELTASONE) 20 MG tablet    Sig: Take 2 tablets (40 mg total) by mouth daily with breakfast.    Dispense:  10 tablet    Refill:  0    Follow-up: No follow-ups on file.    Nani Gasser, MD

## 2020-01-13 NOTE — Patient Instructions (Signed)
Okay to use cold packs for 10 minutes as needed throughout the day for increased pain or tenderness.

## 2020-01-20 ENCOUNTER — Ambulatory Visit (INDEPENDENT_AMBULATORY_CARE_PROVIDER_SITE_OTHER): Payer: Managed Care, Other (non HMO) | Admitting: Family Medicine

## 2020-01-20 ENCOUNTER — Encounter: Payer: Self-pay | Admitting: Family Medicine

## 2020-01-20 ENCOUNTER — Other Ambulatory Visit: Payer: Self-pay

## 2020-01-20 VITALS — BP 113/66 | HR 88 | Ht 62.0 in | Wt 164.0 lb

## 2020-01-20 DIAGNOSIS — S91302A Unspecified open wound, left foot, initial encounter: Secondary | ICD-10-CM | POA: Diagnosis not present

## 2020-01-20 NOTE — Progress Notes (Signed)
Acute Office Visit  Subjective:    Patient ID: Sheryl Taylor, female    DOB: 12/24/1970, 50 y.o.   MRN: [id number]  Chief Complaint  Patient presents with  . Follow-up    HPI Patient is in today for wound care of lesion on left outer foo below the malleolus, status post surgery on December 17.  Did go back to see the surgeon and had 3 sutures removed on Friday.  She has a follow-up on Wednesday with the actual surgeon who did the procedure to have the additional sutures removed.  Some of the dead eschar has come off.  But now she is concerned because there is a hole in the wound.  Past Medical History:  Diagnosis Date  . Asthma   . Bronchitis   . Hyperlipidemia   . Thyroid nodule     Past Surgical History:  Procedure Laterality Date  . CESAREAN SECTION     x 2   . CHOLECYSTECTOMY  2003    Family History  Problem Relation Age of Onset  . Lung cancer Father        lung (50)  . Heart failure Father        (41)  . Hyperlipidemia Father   . CAD Father   . Breast cancer Unknown        great aunt  . Breast cancer Unknown        cousin  . Heart attack Paternal Uncle        bypass surgery   . Breast cancer Maternal Aunt     Social History   Socioeconomic History  . Marital status: Married    Spouse name: Karleen Hampshire   . Number of children: 2  . Years of education: Not on file  . Highest education level: Not on file  Occupational History  . Occupation: Geologist, engineering    Comment: [employer]  Tobacco Use  . Smoking status: Never Smoker  . Smokeless tobacco: Never Used  Substance and Sexual Activity  . Alcohol use: No    Alcohol/week: 0.0 standard drinks  . Drug use: No  . Sexual activity: Yes    Partners: Male  Other Topics Concern  . 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 And has two children. She exercises 2 days a wk for an 1 hour.  Her sister died suddenty at 55 unknown cause and this  makes her very anxious.     Social Determinants of Health   Financial Resource Strain: Not on file  Food Insecurity: Not on file  Transportation Needs: Not on file  Physical Activity: Not on file  Stress: Not on file  Social Connections: Not on file  Intimate Partner Violence: Not on file    Outpatient Medications Prior to Visit  Medication Sig Dispense Refill  . cetirizine (ZYRTEC) 10 MG tablet Take 10 mg by mouth daily.    [name] doxycycline (VIBRA-TABS) 100 MG tablet Take 1 tablet (100 mg total) by mouth 2 (two) times daily. 20 tablet 0  . fluticasone (FLONASE) 50 MCG/ACT nasal spray Place into the nose.    . ibuprofen (ADVIL) 800 MG tablet Take 1 tablet (800 mg total) by mouth every 8 (eight) hours as needed. 30 tablet 2  . predniSONE (DELTASONE) 20 MG tablet Take 2 tablets (40 mg total) by mouth daily with breakfast. 10 tablet 0  . PREVIDENT 5000 BOOSTER PLUS 1.1 % PSTE USE  ONCE DAILY IN PLACE OF REGULAR TOOTHPASTE     No facility-administered medications prior to visit.    Allergies  Allergen Reactions  . Codeine Phosphate Itching  . Buprenorphine Hcl Rash and Swelling  . Morphine And Related Swelling and Rash    Review of Systems     Objective:    Physical Exam    The wound itself measures 2.0 x 1.4 cm.  Depth is approximately 4 mm.      BP 113/66   Pulse 88   Ht 5\' 2"  (1.575 m)   Wt 164 lb (74.4 kg)   SpO2 99%   BMI 30.00 kg/m  Wt Readings from Last 3 Encounters:  01/20/20 164 lb (74.4 kg)  01/13/20 164 lb 1.9 oz (74.4 kg)  08/12/19 168 lb (76.2 kg)    Health Maintenance Due  Topic Date Due  . COLONOSCOPY (Pts 45-52yrs Insurance coverage will need to be confirmed)  Never done    There are no preventive care reminders to display for this patient.   Lab Results  Component Value Date   TSH 3.59 08/12/2019   Lab Results  Component Value Date   WBC 5.8 08/12/2019   HGB 12.1 08/12/2019   HCT 38.4 08/12/2019   MCV 84.2 08/12/2019   PLT 349  08/12/2019   Lab Results  Component Value Date   NA 141 08/12/2019   K 4.5 08/12/2019   CO2 29 08/12/2019   GLUCOSE 102 (H) 08/12/2019   BUN 12 08/12/2019   CREATININE 0.98 08/12/2019   BILITOT 0.4 08/12/2019   ALKPHOS 79 07/29/2016   AST 14 08/12/2019   ALT 9 08/12/2019   PROT 6.6 08/12/2019   ALBUMIN 4.0 07/29/2016   CALCIUM 9.1 08/12/2019   Lab Results  Component Value Date   CHOL 185 08/12/2019   Lab Results  Component Value Date   HDL 51 08/12/2019   Lab Results  Component Value Date   LDLCALC 116 (H) 08/12/2019   Lab Results  Component Value Date   TRIG 79 08/12/2019   Lab Results  Component Value Date   CHOLHDL 3.6 08/12/2019   No results found for: HGBA1C     Assessment & Plan:   Problem List Items Addressed This Visit   None   Visit Diagnoses    Open wound of left foot, initial encounter    -  Primary   Relevant Orders   AMB referral to wound care center     Discussed were getting her to wound care ASAP.  I am concerned that the wound now has a hole in it.  I do not think it is healing well at all she does have a follow-up with the surgeon on Wednesday so did encourage her to actually keep that appointment in the meantime we did place DuoDERM on it to help absorb some of the excess moisture since it is weeping.  I do not smell any foul odor coming out of the wound she still has a couple more days on antibiotics just encouraged her to finish that up but I do not think she needs any additional treatment with antibiotics at this time.  I think unfortunately it is probably getting need some debridement but I think I would feel more comfortable having the wound care clinic do.  If for some reason she is not able to get in on this week then I would like to see her back on Friday before the weekend.  Provide work note to cover  for the next 3 weeks, until January 31.  We need to complete FMLA paperwork as well.  No orders of the defined types were placed in this  encounter.    Nani Gasser, MD

## 2020-01-21 ENCOUNTER — Ambulatory Visit: Payer: Managed Care, Other (non HMO) | Admitting: Family Medicine

## 2020-01-22 ENCOUNTER — Telehealth: Payer: Self-pay | Admitting: Family Medicine

## 2020-01-22 ENCOUNTER — Encounter: Payer: Self-pay | Admitting: Family Medicine

## 2020-01-22 NOTE — Telephone Encounter (Signed)
FMLA forms completed.  Can she send pic over MyChart.  Let me know how visit goes with plastics.

## 2020-01-22 NOTE — Telephone Encounter (Signed)
Patient dropped off FMLA paperwork for Dr Linford Arnold, placed in provider box. AM (01/22/20)

## 2020-01-22 NOTE — Telephone Encounter (Signed)
Spoke w/pt today and she informed me that because she hasn't been released by the plastic surgeon the wound care is NOT going to see her. She said that she had gotten an appointment for Friday however, on her way over to our office to drop off her FMLA forms they called her back and told her that she couldn't be seen. Pt was very tearful and distressed about what's going on with the wound. She has a f/u appt for Friday with Dr. Linford Arnold and will keep that.   Pt did change out her duoderm bandage today and feels like the area is not looking any better. She took pictures of this.   She also wanted Dr. Linford Arnold to know that she was informed by her employer that her FMLA time should be extended out past 02/10/2020 so that she won't need to redo the forms again. I told her that I would let Dr. Linford Arnold know and see what we will need to do.

## 2020-01-23 ENCOUNTER — Encounter: Payer: Self-pay | Admitting: Family Medicine

## 2020-01-24 ENCOUNTER — Ambulatory Visit: Payer: Managed Care, Other (non HMO) | Admitting: Family Medicine

## 2020-01-24 NOTE — Telephone Encounter (Signed)
Sheryl Taylor states she picked up the paperwork today.

## 2020-02-06 ENCOUNTER — Encounter: Payer: Self-pay | Admitting: Family Medicine

## 2020-02-06 ENCOUNTER — Telehealth (INDEPENDENT_AMBULATORY_CARE_PROVIDER_SITE_OTHER): Payer: Managed Care, Other (non HMO) | Admitting: Family Medicine

## 2020-02-06 DIAGNOSIS — H6981 Other specified disorders of Eustachian tube, right ear: Secondary | ICD-10-CM | POA: Diagnosis not present

## 2020-02-06 MED ORDER — PREDNISONE 20 MG PO TABS: ORAL_TABLET | ORAL | 0 refills | Status: AC

## 2020-02-06 NOTE — Progress Notes (Signed)
Pt was seen on 1/3 for R ear pain. She stated that her sxs started again last night only in her R ear she stated that her ear is popping and is still popping this morning.   Denies f/s/c/n/v/d/head or bodyaches, the pain is She has been taking the zyrtec and benadryl.

## 2020-02-06 NOTE — Progress Notes (Signed)
Virtual Visit via Video Note  I connected with [name] on 02/06/20 at 10:50 AM EST by a video enabled telemedicine application and verified that I am speaking with the correct person using two identifiers.   I discussed the limitations of evaluation and management by telemedicine and the availability of in person appointments. The patient expressed understanding and agreed to proceed.  Patient location: at home Provider location: in office  Subjective:    CC: left ear popping.    HPI:  Pt was seen on 1/3 for R ear pain.  Was treated with prednisone.  She stated that her sxs started again last night only in her R ear she stated that her ear is popping and is still popping this morning.  She says it is not really painful but it keeps popping over and over and over to the point that it actually kept her awake last night.  Denies f/s/c/n/v/d/head or bodyaches, the pain is She has been taking the zyrtec and benadryl.    Past medical history, Surgical history, Family history not pertinant except as noted below, Social history, Allergies, and medications have been entered into the medical record, reviewed, and corrections made.   Review of Systems: No fevers, chills, night sweats, weight loss, chest pain, or shortness of breath.   Objective:    General: Speaking clearly in complete sentences without any shortness of breath.  Alert and oriented x3.  Normal judgment. No apparent acute distress.    Impression and Recommendations:    No problem-specific Assessment & Plan notes found for this encounter.  Eustachian tube dysfunction, left-we will treat with another round of prednisone and will taper.  But if this does not treated I really want her to come to the office so I can take a look at her eardrum.  She said she did try to actually call her ENT doctor's office but they cannot see her for several months.  Foot wound, left foot  - overall it is starting to improve she is now  following with wound care management.  They did do a wound culture and recently switched her antibiotic so she is now on Levaquin.  They did an x-ray just to rule out any sign of osteomyelitis and they have changed her wrappings.  They are hoping that they will have to do any type of skin grafting.   Time spent in encounter 20 minutes  I discussed the assessment and treatment plan with the patient. The patient was provided an opportunity to ask questions and all were answered. The patient agreed with the plan and demonstrated an understanding of the instructions.   The patient was advised to call back or seek an in-person evaluation if the symptoms worsen or if the condition fails to improve as anticipated.   Nani Gasser, MD

## 2020-02-16 ENCOUNTER — Encounter: Payer: Self-pay | Admitting: Family Medicine

## 2020-02-17 MED ORDER — FLUCONAZOLE 150 MG PO TABS
150.0000 mg | ORAL_TABLET | Freq: Once | ORAL | 0 refills | Status: AC
Start: 2020-02-17 — End: 2020-02-17

## 2020-02-17 NOTE — Telephone Encounter (Signed)
rx sent

## 2020-02-26 ENCOUNTER — Ambulatory Visit (INDEPENDENT_AMBULATORY_CARE_PROVIDER_SITE_OTHER): Payer: Managed Care, Other (non HMO)

## 2020-02-26 ENCOUNTER — Other Ambulatory Visit: Payer: Self-pay

## 2020-02-26 ENCOUNTER — Ambulatory Visit: Payer: Managed Care, Other (non HMO) | Admitting: Sports Medicine

## 2020-02-26 DIAGNOSIS — G8929 Other chronic pain: Secondary | ICD-10-CM

## 2020-02-26 DIAGNOSIS — M25511 Pain in right shoulder: Secondary | ICD-10-CM | POA: Diagnosis not present

## 2020-02-26 DIAGNOSIS — M7541 Impingement syndrome of right shoulder: Secondary | ICD-10-CM

## 2020-02-26 NOTE — Progress Notes (Signed)
    Procedures performed today:    Procedure: Real-time Ultrasound Guided injection of the right subacromial bursa Device: Samsung HS60  Verbal informed consent obtained.  Time-out conducted.  Noted no overlying erythema, induration, or other signs of local infection.  Skin prepped in a sterile fashion.  Local anesthesia: Topical Ethyl chloride.  With sterile technique and under real time ultrasound guidance:  Noted normal-appearing rotator cuff, 1 cc Kenalog 40, 1 cc lidocaine, 1 cc bupivacaine injected easily Completed without difficulty  Advised to call if fevers/chills, erythema, induration, drainage, or persistent bleeding.  Images permanently stored and available for review in PACS.  Impression: Technically successful ultrasound guided injection.  Independent interpretation of notes and tests performed by another provider:   None.  Brief History, Exam, Impression, and Recommendations:    Impingement syndrome, shoulder, right Sheryl Taylor returns, she did extremely well after injection back in September, subacromial, now having recurrence of pain, repeat subacromial injections, reiterated the importance of rotator cuff rehab exercises, return as needed.    ___________________________________________ Ihor Austin. Benjamin Stain, M.D., ABFM., CAQSM. Primary Care and Sports Medicine [facility]  Adjunct Instructor of Family Medicine  [employer]

## 2020-02-26 NOTE — Assessment & Plan Note (Signed)
Jaimi returns, she did extremely well after injection back in September, subacromial, now having recurrence of pain, repeat subacromial injections, reiterated the importance of rotator cuff rehab exercises, return as needed.

## 2020-04-10 ENCOUNTER — Encounter: Payer: Self-pay | Admitting: Family Medicine

## 2020-04-14 NOTE — Telephone Encounter (Signed)
Shoulder and thank you for looking into this for [place], Amber.

## 2020-04-21 DIAGNOSIS — M7541 Impingement syndrome of right shoulder: Secondary | ICD-10-CM

## 2020-04-23 NOTE — Telephone Encounter (Signed)
MRI ordered.  XR ordered too since last XR was 2020.

## 2020-04-27 ENCOUNTER — Telehealth: Payer: Self-pay | Admitting: Sports Medicine

## 2020-04-27 NOTE — Telephone Encounter (Signed)
This is a pleasant 50 year old female, we have been treating her right shoulder pain for a year and a half now, she has had several subacromial injections, oral medications, she has done several months of physician directed physical therapy, unfortunately she is having recurrence of pain in spite of the injection, she has positive rotator cuff impingement sign such as Neer's, Hawkins, empty can signs, due to failure of conservative treatment for several years we will proceed with MRI for surgical planning.  Of note I asked her to get a shoulder x-ray but she has not done this yet, her last shoulder x-ray was approximately a year and 5 months ago and unrevealing.

## 2020-04-30 ENCOUNTER — Ambulatory Visit (INDEPENDENT_AMBULATORY_CARE_PROVIDER_SITE_OTHER): Payer: Managed Care, Other (non HMO)

## 2020-04-30 ENCOUNTER — Other Ambulatory Visit: Payer: Self-pay

## 2020-04-30 DIAGNOSIS — M7541 Impingement syndrome of right shoulder: Secondary | ICD-10-CM

## 2020-05-05 ENCOUNTER — Telehealth: Payer: Self-pay | Admitting: Sports Medicine

## 2020-05-05 NOTE — Telephone Encounter (Signed)
Spoke with insurance company secretary, peer-to-peer scheduled for 05/07/2020 at 815 with Dr. Devon Freelander. 

## 2020-05-07 NOTE — Telephone Encounter (Signed)
Peer-to-peer conducted this morning, MRI approved, approval number [id number] good until July 28, 2020.

## 2020-05-10 ENCOUNTER — Other Ambulatory Visit: Payer: Self-pay

## 2020-05-10 ENCOUNTER — Ambulatory Visit (INDEPENDENT_AMBULATORY_CARE_PROVIDER_SITE_OTHER): Payer: Managed Care, Other (non HMO)

## 2020-05-10 DIAGNOSIS — M7541 Impingement syndrome of right shoulder: Secondary | ICD-10-CM

## 2020-05-12 ENCOUNTER — Telehealth: Payer: Self-pay

## 2020-05-12 NOTE — Telephone Encounter (Signed)
I explained it the best I could on the result note, she is just one of those patients with very poor medical literacy and so I think we just need to set up a virtual visit or a telephone call for me to describe this in more detail.

## 2020-05-12 NOTE — Telephone Encounter (Signed)
Patient calling to get a better understanding of the MRI results. She read the notes on the actual results but didn't understand them. She sent a Mychart message that has not been answered. Please reply to patient's mychart message and help her better understand what the MRI shows and what this means for her.

## 2020-05-13 ENCOUNTER — Telehealth (INDEPENDENT_AMBULATORY_CARE_PROVIDER_SITE_OTHER): Payer: Managed Care, Other (non HMO) | Admitting: Sports Medicine

## 2020-05-13 DIAGNOSIS — M7541 Impingement syndrome of right shoulder: Secondary | ICD-10-CM

## 2020-05-13 MED ORDER — TRAMADOL HCL 50 MG PO TABS: 50.0000 mg | ORAL_TABLET | Freq: Three times a day (TID) | ORAL | 0 refills | Status: DC | PRN

## 2020-05-13 NOTE — Assessment & Plan Note (Signed)
Sheryl Taylor is a 50 year old female, she has a long history of right shoulder pain, she has done some physical therapy, she has had a couple of injections, ultimately she continued to have pain so we obtained an MRI that showed cuff tendinopathy, AC arthritis and bursitis without any obvious cuff tears, I did not see any rotator cuff tears on ultrasound either. I explained to her the limitations an MRI and ultrasound and that when conservative treatment has failed diagnostic and therapeutic arthroscopy is the typical next step. Referral to Dr. Everardo Pacific, return to see me as needed, I would like her to get some tramadol to use in the meantime.

## 2020-05-13 NOTE — Progress Notes (Signed)
To better understand the MRI results.

## 2020-05-13 NOTE — Progress Notes (Signed)
   Virtual Visit via Telephone   I connected with  Sheryl Taylor  on 05/13/20 by telephone/telehealth and verified that I am speaking with the correct person using two identifiers.   I discussed the limitations, risks, security and privacy concerns of performing an evaluation and management service by telephone, including the higher likelihood of inaccurate diagnosis and treatment, and the availability of in person appointments.  We also discussed the likely need of an additional face to face encounter for complete and high quality delivery of care.  I also discussed with the patient that there may be a patient responsible charge related to this service. The patient expressed understanding and wishes to proceed.  Provider location is in medical facility. Patient location is at their home, different from provider location. People involved in care of the patient during this telehealth encounter were myself, my nurse/medical assistant, and my front office/scheduling team member.  Review of Systems: No fevers, chills, night sweats, weight loss, chest pain, or shortness of breath.   Objective Findings:    General: Speaking full sentences, no audible heavy breathing.  Sounds alert and appropriately interactive.    Independent interpretation of tests performed by another provider:   None.  Brief History, Exam, Impression, and Recommendations:    Impingement syndrome, shoulder, right Chip Boer is a 50 year old female, she has a long history of right shoulder pain, she has done some physical therapy, she has had a couple of injections, ultimately she continued to have pain so we obtained an MRI that showed cuff tendinopathy, AC arthritis and bursitis without any obvious cuff tears, I did not see any rotator cuff tears on ultrasound either. I explained to her the limitations an MRI and ultrasound and that when conservative treatment has failed diagnostic and therapeutic arthroscopy is the typical next  step. Referral to Dr. Everardo Pacific, return to see me as needed, I would like her to get some tramadol to use in the meantime.   I discussed the above assessment and treatment plan with the patient. The patient was provided an opportunity to ask questions and all were answered. The patient agreed with the plan and demonstrated an understanding of the instructions.   The patient was advised to call back or seek an in-person evaluation if the symptoms worsen or if the condition fails to improve as anticipated.   I provided 30 minutes of face to face and non-face-to-face time during this encounter date, time was needed to gather information, review chart, records, communicate/coordinate with staff remotely, as well as complete documentation.   ___________________________________________ Ihor Austin. Benjamin Stain, M.D., ABFM., CAQSM. Primary Care and Sports Medicine [facility]  Adjunct Professor of Family Medicine  [employer]

## 2020-05-13 NOTE — Telephone Encounter (Signed)
Patient scheduled.

## 2020-05-13 NOTE — Telephone Encounter (Signed)
See telephone message

## 2020-05-14 ENCOUNTER — Ambulatory Visit: Payer: Managed Care, Other (non HMO) | Admitting: Family Medicine

## 2020-05-22 ENCOUNTER — Telehealth: Payer: Self-pay | Admitting: Sports Medicine

## 2020-05-22 NOTE — Telephone Encounter (Signed)
I had a telephone discussion with Dr. Everardo Pacific today, he saw Chip Boer, he is not entirely sure that surgery is the best option for her at this juncture particularly considering complications from a prior foot surgery, I agree that it is appropriate to try some more conservative treatments including potential PRP injection and tenotomy into the subacromial bursa and supraspinatus insertion, we may also need to consider more of a neuropathic approach considering her pain is for the most part out of proportion to the degree of pathology seen on her MRI, suggesting a myofascial/fibromyalgia component.  I will see her back to discuss PRP and neuropathic agents.  We will give another few months of conservative treatment, and if failure then Dr. Everardo Pacific will consider diagnostic arthroscopy.

## 2020-05-22 NOTE — Telephone Encounter (Signed)
OK, thank you.

## 2020-05-22 NOTE — Addendum Note (Signed)
Addended by: Nani Gasser D on: 05/22/2020 05:19 PM   Modules accepted: Orders

## 2020-05-27 ENCOUNTER — Encounter: Payer: Self-pay | Admitting: Family Medicine

## 2020-05-28 NOTE — Telephone Encounter (Signed)
Yes either can do OTC or same day appt. Ok for virtual if like

## 2020-06-09 LAB — HM MAMMOGRAPHY

## 2020-06-16 ENCOUNTER — Encounter: Payer: Self-pay | Admitting: Family Medicine

## 2020-06-19 ENCOUNTER — Ambulatory Visit (INDEPENDENT_AMBULATORY_CARE_PROVIDER_SITE_OTHER): Payer: Managed Care, Other (non HMO) | Admitting: Sports Medicine

## 2020-06-19 ENCOUNTER — Other Ambulatory Visit: Payer: Self-pay

## 2020-06-19 DIAGNOSIS — M7541 Impingement syndrome of right shoulder: Secondary | ICD-10-CM

## 2020-06-19 NOTE — Assessment & Plan Note (Signed)
This is a pleasant 50 year old female, a long history of right shoulder pain, she is on physical therapy, she is had a couple of injections, ultimately we obtained an MRI that showed rotator cuff tendinopathy, acromioclavicular osteoarthritis and shoulder bursitis without obvious cuff tears. I did not see any cuff tears on ultrasound. We did get her in with Dr. Everardo Pacific who suggested diagnostic and therapeutic arthroscopy, but also suggested PRP. There are some confounding circumstances here, Sheryl Taylor's father recently passed, she is having some depression and mood lability, she is not interested in any medications, including neuropathic's and antidepressants, we did discuss that she is potentially ruling out modalities that could be very beneficial to her. She is unable to afford PRP. And away the above is good news, the pain is not enough for her to take a medication so potentially she can live with it, I have asked her to discuss this further with Dr. Everardo Pacific as this only leaves diagnostic and therapeutic shoulder arthroscopy as an option.

## 2020-06-19 NOTE — Progress Notes (Signed)
    Procedures performed today:    None.  Independent interpretation of notes and tests performed by another provider:   None.  Brief History, Exam, Impression, and Recommendations:    Impingement syndrome, shoulder, right This is a pleasant 50 year old female, a long history of right shoulder pain, she is on physical therapy, she is had a couple of injections, ultimately we obtained an MRI that showed rotator cuff tendinopathy, acromioclavicular osteoarthritis and shoulder bursitis without obvious cuff tears. I did not see any cuff tears on ultrasound. We did get her in with Dr. Everardo Pacific who suggested diagnostic and therapeutic arthroscopy, but also suggested PRP. There are some confounding circumstances here, Sheryl Taylor's father recently passed, she is having some depression and mood lability, she is not interested in any medications, including neuropathic's and antidepressants, we did discuss that she is potentially ruling out modalities that could be very beneficial to her. She is unable to afford PRP. And away the above is good news, the pain is not enough for her to take a medication so potentially she can live with it, I have asked her to discuss this further with Dr. Everardo Pacific as this only leaves diagnostic and therapeutic shoulder arthroscopy as an option.    ___________________________________________ Ihor Austin. Benjamin Stain, M.D., ABFM., CAQSM. Primary Care and Sports Medicine [facility]  Adjunct Instructor of Family Medicine  [employer]

## 2020-06-22 ENCOUNTER — Ambulatory Visit: Payer: Managed Care, Other (non HMO) | Admitting: Sports Medicine

## 2020-07-22 LAB — COMPREHENSIVE METABOLIC PANEL
Albumin: 4.1 (ref 3.5–5.0)
Calcium: 9 (ref 8.7–10.7)
GFR calc non Af Amer: 80
Globulin: 2.5

## 2020-07-22 LAB — CBC AND DIFFERENTIAL
HCT: 39 (ref 36–46)
Hemoglobin: 12.4 (ref 12.0–16.0)
Neutrophils Absolute: 3.1
Platelets: 309 (ref 150–399)
WBC: 5.5

## 2020-07-22 LAB — HEPATIC FUNCTION PANEL
ALT: 12 (ref 7–35)
AST: 12 — AB (ref 13–35)
Alkaline Phosphatase: 84 (ref 25–125)

## 2020-07-22 LAB — CBC: RBC: 4.47 (ref 3.87–5.11)

## 2020-07-22 LAB — BASIC METABOLIC PANEL
BUN: 19 (ref 4–21)
CO2: 22 (ref 13–22)
Chloride: 108 (ref 99–108)
Creatinine: 0.9 (ref 0.5–1.1)
Glucose: 96
Potassium: 4.4 (ref 3.4–5.3)
Sodium: 143 (ref 137–147)

## 2020-07-22 LAB — RESULTS CONSOLE HPV: CHL HPV: NEGATIVE

## 2020-07-22 LAB — LIPID PANEL
Cholesterol: 157 (ref 0–200)
HDL: 45 (ref 35–70)
LDL Cholesterol: 100
Triglycerides: 62 (ref 40–160)

## 2020-07-22 LAB — HM PAP SMEAR
HM Pap smear: NEGATIVE
HM Pap smear: NEGATIVE

## 2020-07-22 LAB — TSH: TSH: 1.81 (ref 0.41–5.90)

## 2020-08-12 ENCOUNTER — Encounter: Payer: Self-pay | Admitting: Family Medicine

## 2020-08-12 ENCOUNTER — Other Ambulatory Visit: Payer: Self-pay

## 2020-08-12 ENCOUNTER — Ambulatory Visit (INDEPENDENT_AMBULATORY_CARE_PROVIDER_SITE_OTHER): Payer: Managed Care, Other (non HMO) | Admitting: Family Medicine

## 2020-08-12 VITALS — BP 118/64 | HR 82 | Temp 98.3°F | Ht 62.0 in | Wt 166.0 lb

## 2020-08-12 DIAGNOSIS — F4321 Adjustment disorder with depressed mood: Secondary | ICD-10-CM | POA: Diagnosis not present

## 2020-08-12 DIAGNOSIS — Z0000 Encounter for general adult medical examination without abnormal findings: Secondary | ICD-10-CM | POA: Diagnosis not present

## 2020-08-12 NOTE — Progress Notes (Signed)
Subjective:     Sheryl Taylor is a 50 y.o. female and is here for a comprehensive physical exam. The patient reports no problems.  Unfortunately she recently lost her father to lung cancer. He had a prior history of lung cancer and has had part of the lung removed.  He is also been trying to be supportive of her mother who actually never learned to drive.  Fortunately the wound on her left ankle has finally healed after months of working with the wound specialist.  She has been back at work and school starts back again in a couple of weeks.  Social History   Socioeconomic History   Marital status: Married    Spouse name: Karleen Hampshire    Number of children: 2   Years of education: Not on file   Highest education level: Not on file  Occupational History   Occupation: Geologist, engineering    Comment: [employer]  Tobacco Use   Smoking status: Never   Smokeless tobacco: Never  Substance and Sexual Activity   Alcohol use: No    Alcohol/week: 0.0 standard drinks   Drug use: No   Sexual activity: Yes    Partners: Male  Other Topics Concern   Not on file  Social History Narrative   Patient works as a Research officer, trade union with [employer] she is married to [place]. And has two children. She exercises 2 days a wk for an 1 hour.  Her sister died suddenty at 38 unknown cause and this makes her very anxious.     Social Determinants of Health   Financial Resource Strain: Not on file  Food Insecurity: Not on file  Transportation Needs: Not on file  Physical Activity: Not on file  Stress: Not on file  Social Connections: Not on file  Intimate Partner Violence: Not on file   Health Maintenance  Topic Date Due   COVID-19 Vaccine (3 - Booster for Pfizer series) 04/02/2020   COLONOSCOPY (Pts 45-33yrs Insurance coverage will need to be confirmed)  08/12/2021 (Originally 07/16/2017)   INFLUENZA VACCINE  01/10/2024 (Originally 08/10/2020)   MAMMOGRAM  06/09/2021   PAP SMEAR-Modifier  06/11/2024   TETANUS/TDAP   08/11/2029   Hepatitis C Screening  Completed   HIV Screening  Completed   Pneumococcal Vaccine 64-48 Years old  Aged Out   HPV VACCINES  Aged Out    The following portions of the patient's history were reviewed and updated as appropriate: allergies, current medications, past family history, past medical history, past social history, past surgical history, and problem list.  Review of Systems Pertinent items are noted in HPI.   Objective:    BP 118/64   Pulse 82   Temp 98.3 F (36.8 C) (Oral)   Ht 5\' 2"  (1.575 m)   Wt 166 lb (75.3 kg)   SpO2 99% Comment: on RA  BMI 30.36 kg/m  General appearance: alert, cooperative, and appears stated age Head: Normocephalic, without obvious abnormality, atraumatic Eyes:  conj clear, EOMI, PEERLA Ears: normal TM's and external ear canals both ears Nose: Nares normal. Septum midline. Mucosa normal. No drainage or sinus tenderness. Throat: lips, mucosa, and tongue normal; teeth and gums normal Neck: no adenopathy, no carotid bruit, no JVD, supple, symmetrical, trachea midline, and thyroid not enlarged, symmetric, no tenderness/mass/nodules Back: symmetric, no curvature. ROM normal. No CVA tenderness. Lungs: clear to auscultation bilaterally Heart: regular rate and rhythm, S1, S2 normal, no murmur, click, rub or gallop Abdomen: soft, non-tender; bowel sounds normal; no masses,  no organomegaly Extremities: extremities normal, atraumatic, no cyanosis or edema Pulses: 2+ and symmetric Skin: Skin color, texture, turgor normal. No rashes or lesions Lymph nodes: Cervical, supraclavicular, and axillary nodes normal. Neurologic: Alert and oriented X 3, normal strength and tone. Normal symmetric reflexes. Normal coordination and gait    Assessment:    Healthy female exam.    Plan:     See After Visit Summary for Counseling Recommendations  Keep up a regular exercise program and make sure you are eating a healthy diet Try to eat 4 servings of  dairy a day, or if you are lactose intolerant take a calcium with vitamin D daily.  Your vaccines are up to date.    Grief-just encouraged her to reach out if she feels like she currently struggling we can with therapy if needed.

## 2020-08-13 ENCOUNTER — Telehealth: Payer: Self-pay

## 2020-08-13 NOTE — Telephone Encounter (Signed)
Medical records request for recent labs faxed to [facility] at Dr. Shelah Lewandowsky request.  Tiajuana Amass, CMA

## 2020-08-20 ENCOUNTER — Encounter: Payer: Self-pay | Admitting: Family Medicine

## 2020-10-06 ENCOUNTER — Ambulatory Visit (INDEPENDENT_AMBULATORY_CARE_PROVIDER_SITE_OTHER): Payer: Managed Care, Other (non HMO) | Admitting: Family Medicine

## 2020-10-06 ENCOUNTER — Encounter: Payer: Self-pay | Admitting: Family Medicine

## 2020-10-06 VITALS — BP 103/69 | HR 90 | Temp 98.2°F | Wt 165.0 lb

## 2020-10-06 DIAGNOSIS — M79602 Pain in left arm: Secondary | ICD-10-CM | POA: Diagnosis not present

## 2020-10-06 DIAGNOSIS — W57XXXA Bitten or stung by nonvenomous insect and other nonvenomous arthropods, initial encounter: Secondary | ICD-10-CM

## 2020-10-06 DIAGNOSIS — S70361A Insect bite (nonvenomous), right thigh, initial encounter: Secondary | ICD-10-CM | POA: Diagnosis not present

## 2020-10-06 NOTE — Patient Instructions (Signed)
Try taking NSAID (ibuprofen, Aleve, etc) regularly for the next week or two Heat/ice throughout the day Gentle stretches - exercise handout provided If no improvement or if symptoms worsen in 2 weeks we can discuss ordering Lyme blood work and other tests/imaging depending on your symptoms  Hope you feel better soon!

## 2020-10-06 NOTE — Progress Notes (Signed)
Acute Office Visit  Subjective:    Patient ID: Sheryl Taylor, female    DOB: [birth year], 50 y.o.   MRN: [id number]  Chief Complaint  Patient presents with   Insect Bite    HPI Patient is in today for tick bite   Patient felt a sore area to R inner leg Friday night and noticed a small bump. Husband was able to remove a tick. States it was a very small "deer tick." Unsure of when it attached. She denies any spreading rash, headaches, fevers, generalized body aches, etc. She did have some L thigh soreness a few days ago that has resolved. Additionally, she reports her left deltoid/bicep area has been sore for several days, even prior to the tick bite. States she really noticed it the other night when her son pulled her arm back and caused soreness to her proximal left upper arm. She denies any swelling, erythema, injury/trauma, loss of function/ROM, or significant weakness, radiation, numbness/tingling.   Past Medical History:  Diagnosis Date   Asthma    Bronchitis    Hyperlipidemia    Thyroid nodule     Past Surgical History:  Procedure Laterality Date   CESAREAN SECTION     x 2    CHOLECYSTECTOMY  2003    Family History  Problem Relation Age of Onset   Lung cancer Father        lung (50)   Heart failure Father        (40)   Hyperlipidemia Father    CAD Father    Breast cancer Maternal Aunt    Heart attack Paternal Uncle        bypass surgery    Breast cancer Other        great aunt   Breast cancer Other        cousin    Social History   Socioeconomic History   Marital status: Married    Spouse name: Karleen Hampshire    Number of children: 2   Years of education: Not on file   Highest education level: Not on file  Occupational History   Occupation: Geologist, engineering    Comment: [employer]  Tobacco Use   Smoking status: Never   Smokeless tobacco: Never  Substance and Sexual Activity   Alcohol use: No    Alcohol/week: 0.0 standard drinks   Drug use: No   Sexual  activity: Yes    Partners: Male  Other Topics Concern   Not on file  Social History Narrative   Patient works as a Research officer, 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 And has two children. She exercises 2 days a wk for an 1 hour.  Her sister died suddenty at 63 unknown cause and this makes her very anxious.     Social Determinants of Health   Financial Resource Strain: Not on file  Food Insecurity: Not on file  Transportation Needs: Not on file  Physical Activity: Not on file  Stress: Not on file  Social Connections: Not on file  Intimate Partner Violence: Not on file    Outpatient Medications Prior to Visit  Medication Sig Dispense Refill   cetirizine (ZYRTEC) 10 MG tablet Take 10 mg by mouth daily.     fluticasone (FLONASE) 50 MCG/ACT nasal spray Place into the nose.     ibuprofen (ADVIL) 800 MG tablet Take 1 tablet (800 mg total) by mouth every 8 (eight) hours as needed. 30 tablet 2   PREVIDENT 5000 BOOSTER PLUS 1.1 %  PSTE USE ONCE DAILY IN PLACE OF REGULAR TOOTHPASTE     No facility-administered medications prior to visit.    Allergies  Allergen Reactions   Codeine Phosphate Itching   Buprenorphine Hcl Rash and Swelling   Morphine And Related Swelling and Rash    Review of Systems All review of systems negative except what is listed in the HPI     Objective:    Physical Exam Constitutional:      Appearance: Normal appearance.  HENT:     Head: Normocephalic and atraumatic.  Musculoskeletal:     Comments: Mild discomfort with L shoulder abduction and external rotation, no erythema, rashes  Skin:    Comments: See picture of location of tick bite, right inner leg, slightly raised, no surrounding erythema/edema  Neurological:     Mental Status: She is alert and oriented to person, place, and time.  Psychiatric:        Mood and Affect: Mood normal.        Behavior: Behavior normal.        Thought Content: Thought content normal.        Judgment: Judgment  normal.    BP 103/69 (BP Location: Left Arm, Patient Position: Sitting, Cuff Size: Normal)   Pulse 90   Temp 98.2 F (36.8 C) (Oral)   Wt 165 lb 0.6 oz (74.9 kg)   BMI 30.19 kg/m  Wt Readings from Last 3 Encounters:  10/06/20 165 lb 0.6 oz (74.9 kg)  08/12/20 166 lb (75.3 kg)  01/20/20 164 lb (74.4 kg)    There are no preventive care reminders to display for this patient.   There are no preventive care reminders to display for this patient.   Lab Results  Component Value Date   TSH 1.81 07/22/2020   Lab Results  Component Value Date   WBC 5.5 07/22/2020   HGB 12.4 07/22/2020   HCT 39 07/22/2020   MCV 84.2 08/12/2019   PLT 309 07/22/2020   Lab Results  Component Value Date   NA 143 07/22/2020   K 4.4 07/22/2020   CO2 22 07/22/2020   GLUCOSE 102 (H) 08/12/2019   BUN 19 07/22/2020   CREATININE 0.9 07/22/2020   BILITOT 0.4 08/12/2019   ALKPHOS 84 07/22/2020   AST 12 (A) 07/22/2020   ALT 12 07/22/2020   PROT 6.6 08/12/2019   ALBUMIN 4.1 07/22/2020   CALCIUM 9.0 07/22/2020   Lab Results  Component Value Date   CHOL 157 07/22/2020   Lab Results  Component Value Date   HDL 45 07/22/2020   Lab Results  Component Value Date   LDLCALC 100 07/22/2020   Lab Results  Component Value Date   TRIG 62 07/22/2020   Lab Results  Component Value Date   CHOLHDL 3.6 08/12/2019   No results found for: HGBA1C     Assessment & Plan:   1. Tick bite of right thigh, initial encounter No EM rash or high risk travel for Lyme. Educated patient that it can take a few weeks for antibodies to develop and that testing isn't always recommended in our area especially if no systemic symptoms. Patient agreeable to watch and wait for 2 weeks and return if symptoms develop.  2. Left arm pain Musculoskeletal pain with unsure trigger. Recommend we start conservatively with a few weeks of NSAIDs, ice/heat, exercises/stretches. If symptoms persist or worsen, we can image. States she  will occasionally have other large joints ache, but not consistent and description does not  raise high suspicion of PMR, but encouraged her to keep a diary of her symptoms so we can further workup if needed.   Patient aware of signs/symptoms requiring further/urgent evaluation.   Follow-up as needed or if symptoms worsen or fail to improve over the next 2-3 weeks.   Lollie Marrow Reola Calkins, DNP, FNP-C

## 2020-10-11 ENCOUNTER — Emergency Department
Admission: RE | Admit: 2020-10-11 | Discharge: 2020-10-11 | Disposition: A | Discharge status: Home / Self Care | Payer: Managed Care, Other (non HMO) | Source: Ambulatory Visit

## 2020-10-11 ENCOUNTER — Other Ambulatory Visit: Payer: Self-pay

## 2020-10-11 VITALS — BP 125/83 | HR 68 | Temp 97.8°F | Resp 16

## 2020-10-11 DIAGNOSIS — J0110 Acute frontal sinusitis, unspecified: Secondary | ICD-10-CM

## 2020-10-11 MED ORDER — ALBUTEROL SULFATE HFA 108 (90 BASE) MCG/ACT IN AERS: 1.0000 | INHALATION_SPRAY | Freq: Four times a day (QID) | RESPIRATORY_TRACT | 0 refills | Status: DC | PRN

## 2020-10-11 MED ORDER — AMOXICILLIN 875 MG PO TABS: 875.0000 mg | ORAL_TABLET | Freq: Two times a day (BID) | ORAL | 0 refills | Status: AC

## 2020-10-11 NOTE — ED Provider Notes (Signed)
[facility]    CSN: [id number] Arrival date & time: 10/11/20  1347      History   Chief Complaint Chief Complaint  Patient presents with   Facial Pain    HPI Sheryl Taylor is a 50 y.o. female.   HPI  She has a forceful harsh cough.  Its been going on for over a week.  She also has sinus pressure and pain.  Points to her frontal sinuses as being painful.  Her teeth also hurt.  He is having yellow sputum.  Feels fatigued.  Headache.  Nursing note reviewed.  Non-smoker  Past Medical History:  Diagnosis Date   Asthma    Bronchitis    Hyperlipidemia    Thyroid nodule     Patient Active Problem List   Diagnosis Date Noted   Mass of ankle, left 07/26/2019   Adnexal mass 04/25/2019   Abnormal uterine bleeding (AUB) 04/25/2019   Impingement syndrome, shoulder, right 12/17/2018   Irregular periods 10/15/2018   Radicular pain 10/15/2018   Depressed mood 10/15/2018   Plantar fasciitis, right 07/29/2016   Lumbar degenerative disc disease 09/26/2014   Strain of left hip adductor muscle 09/26/2014   Tinnitus 06/05/2014   Anemia, iron deficiency 06/05/2014   Insomnia 06/05/2014   Family history of premature coronary heart disease 07/03/2013    Past Surgical History:  Procedure Laterality Date   CESAREAN SECTION     x 2    CHOLECYSTECTOMY  2003    OB History   No obstetric history on file.      Home Medications    Prior to Admission medications   Medication Sig Start Date End Date Taking? Authorizing Provider  albuterol (VENTOLIN HFA) 108 (90 Base) MCG/ACT inhaler Inhale 1-2 puffs into the lungs every 6 (six) hours as needed for wheezing or shortness of breath. 10/11/20  Yes Eustace Moore, MD  amoxicillin (AMOXIL) 875 MG tablet Take 1 tablet (875 mg total) by mouth 2 (two) times daily for 10 days. 10/11/20 10/21/20 Yes Eustace Moore, MD  cetirizine (ZYRTEC) 10 MG tablet Take 10 mg by mouth daily.   Yes [provider]  fluticasone  (FLONASE) 50 MCG/ACT nasal spray Place into the nose.   Yes [provider]  ibuprofen (ADVIL) 800 MG tablet Take 1 tablet (800 mg total) by mouth every 8 (eight) hours as needed. 01/28/19   Agapito Games, MD  PREVIDENT 5000 BOOSTER PLUS 1.1 % PSTE USE ONCE DAILY IN PLACE OF REGULAR TOOTHPASTE 01/07/19   [provider]    Family History Family History  Problem Relation Age of Onset   Lung cancer Father        lung (50)   Heart failure Father        (53)   Hyperlipidemia Father    CAD Father    Breast cancer Maternal Aunt    Heart attack Paternal Uncle        bypass surgery    Breast cancer Other        great aunt   Breast cancer Other        cousin    Social History Social History   Tobacco Use   Smoking status: Never   Smokeless tobacco: Never  Substance Use Topics   Alcohol use: No    Alcohol/week: 0.0 standard drinks   Drug use: No     Allergies   Codeine phosphate, Buprenorphine hcl, and Morphine and related   Review of Systems Review  of Systems See HPI  Physical Exam Triage Vital Signs ED Triage Vitals  Enc Vitals Group     BP 10/11/20 1418 125/83     Pulse Rate 10/11/20 1418 68     Resp 10/11/20 1418 16     Temp 10/11/20 1418 97.8 F (36.6 C)     Temp Source 10/11/20 1418 Oral     SpO2 10/11/20 1418 95 %     Weight --      Height --      Head Circumference --      Peak Flow --      Pain Score 10/11/20 1416 2     Pain Loc --      Pain Edu? --      Excl. in GC? --    No data found.  Updated Vital Signs BP 125/83 (BP Location: Right Arm)   Pulse 68   Temp 97.8 F (36.6 C) (Oral)   Resp 16   SpO2 95%      Physical Exam Constitutional:      General: She is not in acute distress.    Appearance: She is well-developed and normal weight.  HENT:     Head: Normocephalic and atraumatic.     Right Ear: Tympanic membrane and ear canal normal.     Left Ear: Tympanic membrane and ear canal normal.     Nose: Congestion  present.     Mouth/Throat:     Pharynx: Posterior oropharyngeal erythema present.     Comments: Posterior pharynx is injected.  Sinuses are tender.  Nasal membranes are swollen and injected.  AC nodes tender Eyes:     Conjunctiva/sclera: Conjunctivae normal.     Pupils: Pupils are equal, round, and reactive to light.  Cardiovascular:     Rate and Rhythm: Normal rate and regular rhythm.     Heart sounds: Normal heart sounds.  Pulmonary:     Effort: Pulmonary effort is normal. No respiratory distress.     Breath sounds: Wheezing present.     Comments: Expiratory wheezing, accented with cough Abdominal:     General: There is no distension.     Palpations: Abdomen is soft.  Musculoskeletal:        General: Normal range of motion.     Cervical back: Normal range of motion.  Lymphadenopathy:     Cervical: Cervical adenopathy present.  Skin:    General: Skin is warm and dry.  Neurological:     Mental Status: She is alert.  Psychiatric:        Behavior: Behavior normal.     UC Treatments / Results  Labs (all labs ordered are listed, but only abnormal results are displayed) Labs Reviewed - No data to display  EKG   Radiology No results found.  Procedures Procedures (including critical care time)  Medications Ordered in UC Medications - No data to display  Initial Impression / Assessment and Plan / UC Course  I have reviewed the triage vital signs and the nursing notes.  Pertinent labs & imaging results that were available during my care of the patient were reviewed by me and considered in my medical decision making (see chart for details).     Final Clinical Impressions(s) / UC Diagnoses   Final diagnoses:  Acute non-recurrent frontal sinusitis     Discharge Instructions      Continue Flonase for the sinus congestion Use albuterol inhaler for coughing and wheezing Take antibiotic 2 times a day Drink lots of  water I recommend you stay home from work tomorrow  to get rest   ED Prescriptions     Medication Sig Dispense Auth. Provider   amoxicillin (AMOXIL) 875 MG tablet Take 1 tablet (875 mg total) by mouth 2 (two) times daily for 10 days. 20 tablet Eustace Moore, MD   albuterol (VENTOLIN HFA) 108 (90 Base) MCG/ACT inhaler Inhale 1-2 puffs into the lungs every 6 (six) hours as needed for wheezing or shortness of breath. 18 g Eustace Moore, MD      PDMP not reviewed this encounter.   Eustace Moore, MD 10/11/20 [phone]

## 2020-10-11 NOTE — ED Triage Notes (Signed)
Patient presents to Urgent Care with complaints of sinus pressure, headache, hoarseness since 6 days ago. Patient reports nasal congestion, laryngitis, ear popping, headache, teeth pain. Taking Zyrtec, Flonase, Dayquil, Nyquil, Benadryl.

## 2020-10-11 NOTE — Discharge Instructions (Addendum)
Continue Flonase for the sinus congestion Use albuterol inhaler for coughing and wheezing Take antibiotic 2 times a day Drink lots of water I recommend you stay home from work tomorrow to get rest

## 2020-10-20 ENCOUNTER — Ambulatory Visit: Payer: Managed Care, Other (non HMO) | Admitting: Family Medicine

## 2020-12-14 ENCOUNTER — Telehealth: Payer: Self-pay | Admitting: Family Medicine

## 2020-12-14 DIAGNOSIS — Z20828 Contact with and (suspected) exposure to other viral communicable diseases: Secondary | ICD-10-CM

## 2020-12-14 MED ORDER — OSELTAMIVIR PHOSPHATE 75 MG PO CAPS: 75.0000 mg | ORAL_CAPSULE | Freq: Every day | ORAL | 0 refills | Status: DC

## 2020-12-14 NOTE — Telephone Encounter (Signed)
Husband dx with flu today. She is asymptomatic but has been sharing bedroom, etc.  Tamiflu prophylaxis sent.

## 2020-12-24 ENCOUNTER — Ambulatory Visit: Payer: Managed Care, Other (non HMO) | Admitting: Family Medicine

## 2020-12-24 ENCOUNTER — Ambulatory Visit: Payer: Self-pay

## 2020-12-25 ENCOUNTER — Other Ambulatory Visit: Payer: Self-pay

## 2020-12-25 ENCOUNTER — Encounter: Payer: Self-pay | Admitting: Family Medicine

## 2020-12-25 ENCOUNTER — Ambulatory Visit: Payer: Managed Care, Other (non HMO) | Admitting: Family Medicine

## 2020-12-25 VITALS — BP 111/66 | HR 74 | Temp 98.7°F | Resp 14 | Ht 62.0 in | Wt 164.0 lb

## 2020-12-25 DIAGNOSIS — R0789 Other chest pain: Secondary | ICD-10-CM | POA: Diagnosis not present

## 2020-12-25 DIAGNOSIS — M940 Chondrocostal junction syndrome [Tietze]: Secondary | ICD-10-CM | POA: Diagnosis not present

## 2020-12-25 NOTE — Progress Notes (Signed)
Pt reports that earlier this week she bent over to kiss her husband and she felt a sharp pain above her R breast.  She said that she had pain all day yesterday and when she takes a deep breath it feels like its catching. Denies any injury/trauma or heavy lifting. No f/s/c/n/v.

## 2020-12-25 NOTE — Progress Notes (Signed)
Acute Office Visit  Subjective:    Patient ID: Sheryl Taylor, female    DOB: 1970/10/15, 50 y.o.   MRN: [id number]  Chief Complaint  Patient presents with   Breast Pain    HPI Patient is in today for right sided chest pain that started Wednesday, 2 days ago when she bent forward she had sudden right sided chest pain.  Just lasted a couple seconds.  She then got into bed and about 3 more times she had sudden pain that lasted a few seconds.  Then the next day which was yesterday it hurt all day.  But it was definitely aggravated by certain activities.  It does seem to be aggravated by bending forward.  It is also aggravated by taking a deep breath and sometimes even just turning her neck.  She denies any cold symptoms such as sore throat fevers chills etc.  She has not had any shortness of breath.  She did not take anything for it she was a little bit nervous to do so.  Did have diarrhea all day yesterday.    1 day of upper thigh pain 1 days last week but went away.    Past Medical History:  Diagnosis Date   Asthma    Bronchitis    Hyperlipidemia    Thyroid nodule     Past Surgical History:  Procedure Laterality Date   CESAREAN SECTION     x 2    CHOLECYSTECTOMY  2003    Family History  Problem Relation Age of Onset   Lung cancer Father        lung (50)   Heart failure Father        (34)   Hyperlipidemia Father    CAD Father    Breast cancer Maternal Aunt    Heart attack Paternal Uncle        bypass surgery    Breast cancer Other        great aunt   Breast cancer Other        cousin    Social History   Socioeconomic History   Marital status: Married    Spouse name: Karleen Hampshire    Number of children: 2   Years of education: Not on file   Highest education level: Not on file  Occupational History   Occupation: Geologist, engineering    Comment: [email or web address]  Tobacco Use   Smoking status: Never   Smokeless tobacco: Never  Substance and Sexual Activity   Alcohol use: No     Alcohol/week: 0.0 standard drinks   Drug use: No   Sexual activity: Yes    Partners: Male  Other Topics Concern   Not on file  Social History Narrative   Patient works as a Research officer, 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 She exercises 2 days a wk for an 1 hour.  Her sister died suddenty at 79 unknown cause and this makes her very anxious.     Social Determinants of Health   Financial Resource Strain: Not on file  Food Insecurity: Not on file  Transportation Needs: Not on file  Physical Activity: Not on file  Stress: Not on file  Social Connections: Not on file  Intimate Partner Violence: Not on file    Outpatient Medications Prior to Visit  Medication Sig Dispense Refill   valACYclovir (VALTREX) 500 MG tablet Take 500 mg by mouth 2 (two) times daily.     albuterol (VENTOLIN HFA) 108 (  90 Base) MCG/ACT inhaler Inhale 1-2 puffs into the lungs every 6 (six) hours as needed for wheezing or shortness of breath. 18 g 0   cetirizine (ZYRTEC) 10 MG tablet Take 10 mg by mouth daily.     fluticasone (FLONASE) 50 MCG/ACT nasal spray Place into the nose.     ibuprofen (ADVIL) 800 MG tablet Take 1 tablet (800 mg total) by mouth every 8 (eight) hours as needed. 30 tablet 2   PREVIDENT 5000 BOOSTER PLUS 1.1 % PSTE USE ONCE DAILY IN PLACE OF REGULAR TOOTHPASTE     oseltamivir (TAMIFLU) 75 MG capsule Take 1 capsule (75 mg total) by mouth daily. 10 capsule 0   No facility-administered medications prior to visit.    Allergies  Allergen Reactions   Codeine Phosphate Itching   Buprenorphine Hcl Rash and Swelling   Morphine And Related Swelling and Rash    Review of Systems     Objective:    Physical Exam Vitals and nursing note reviewed.  Constitutional:      Appearance: Normal appearance. She is well-developed.  HENT:     Head: Normocephalic and atraumatic.     Right Ear: External ear normal.     Left Ear: External ear normal.  Cardiovascular:     Rate  and Rhythm: Normal rate and regular rhythm.     Heart sounds: Normal heart sounds.     Comments: Tender over the right upper anterior chest towards the midline. Pulmonary:     Effort: Pulmonary effort is normal.     Breath sounds: Normal breath sounds.  Musculoskeletal:     Cervical back: Neck supple. No rigidity or tenderness.  Skin:    General: Skin is warm and dry.  Neurological:     Mental Status: She is alert and oriented to person, place, and time.  Psychiatric:        Behavior: Behavior normal.    BP 111/66    Pulse 74    Temp 98.7 F (37.1 C)    Resp 14    Ht 5\' 2"  (1.575 m)    Wt 164 lb (74.4 kg)    SpO2 99%    BMI 30.00 kg/m  Wt Readings from Last 3 Encounters:  12/25/20 164 lb (74.4 kg)  10/06/20 165 lb 0.6 oz (74.9 kg)  08/12/20 166 lb (75.3 kg)    There are no preventive care reminders to display for this patient.   There are no preventive care reminders to display for this patient.   Lab Results  Component Value Date   TSH 1.81 07/22/2020   Lab Results  Component Value Date   WBC 5.5 07/22/2020   HGB 12.4 07/22/2020   HCT 39 07/22/2020   MCV 84.2 08/12/2019   PLT 309 07/22/2020   Lab Results  Component Value Date   NA 143 07/22/2020   K 4.4 07/22/2020   CO2 22 07/22/2020   GLUCOSE 102 (H) 08/12/2019   BUN 19 07/22/2020   CREATININE 0.9 07/22/2020   BILITOT 0.4 08/12/2019   ALKPHOS 84 07/22/2020   AST 12 (A) 07/22/2020   ALT 12 07/22/2020   PROT 6.6 08/12/2019   ALBUMIN 4.1 07/22/2020   CALCIUM 9.0 07/22/2020   Lab Results  Component Value Date   CHOL 157 07/22/2020   Lab Results  Component Value Date   HDL 45 07/22/2020   Lab Results  Component Value Date   LDLCALC 100 07/22/2020   Lab Results  Component Value Date  TRIG 62 07/22/2020   Lab Results  Component Value Date   CHOLHDL 3.6 08/12/2019   No results found for: HGBA1C     Assessment & Plan:   Problem List Items Addressed This Visit   None Visit Diagnoses      Atypical chest pain    -  Primary   Relevant Orders   EKG 12-Lead   Costochondritis, acute          Atypical chest pain-most consistent with costochondritis based on history and exam.  EKG was normal which was reassuring.  She does have premature family history of coronary artery disease.  She has tenderness directly over the right upper chest wall where the ribs meet the sternum.  I was able to reproduce her pain.  EKG today shows rate of 79 bpm, normal sinus rhythm with no acute ST-T wave changes.  Recommend a trial of heat and/or ice as well.  Avoid heavy lifting.  Recommend Motrin 600 mg every 6 hours or Motrin 800 mg every 8 hours.  Make sure to take something to protect her stomach she thinks she has some Prilosec or Nexium at home.  She already has some prescription Motrin at home and says she will just use that she will call back if she needs more.  No orders of the defined types were placed in this encounter.    Nani Gasser, MD

## 2021-01-13 ENCOUNTER — Telehealth (INDEPENDENT_AMBULATORY_CARE_PROVIDER_SITE_OTHER): Payer: BC Managed Care – PPO | Admitting: Physician Assistant

## 2021-01-13 ENCOUNTER — Encounter: Payer: Self-pay | Admitting: Physician Assistant

## 2021-01-13 VITALS — Temp 98.0°F | Ht 62.0 in | Wt 164.0 lb

## 2021-01-13 DIAGNOSIS — J329 Chronic sinusitis, unspecified: Secondary | ICD-10-CM

## 2021-01-13 DIAGNOSIS — J040 Acute laryngitis: Secondary | ICD-10-CM

## 2021-01-13 DIAGNOSIS — J40 Bronchitis, not specified as acute or chronic: Secondary | ICD-10-CM | POA: Diagnosis not present

## 2021-01-13 MED ORDER — PREDNISONE 50 MG PO TABS: ORAL_TABLET | ORAL | 0 refills | Status: DC

## 2021-01-13 MED ORDER — AMOXICILLIN 875 MG PO TABS: 875.0000 mg | ORAL_TABLET | Freq: Two times a day (BID) | ORAL | 0 refills | Status: DC

## 2021-01-13 MED ORDER — HYDROCODONE BIT-HOMATROP MBR 5-1.5 MG/5ML PO SOLN: 5.0000 mL | Freq: Four times a day (QID) | ORAL | 0 refills | Status: DC | PRN

## 2021-01-13 NOTE — Progress Notes (Signed)
..  Virtual Visit via Telephone Note  I connected with Sheryl Taylor on 01/14/2020 at  2:40 PM EST by telephone and verified that I am speaking with the correct person using two identifiers.  Location: Patient: home Provider: clinic  .Marland KitchenParticipating in visit:  Patient: Sheryl Taylor Provider: Tandy Gaw PA-C Provider in training: Shawna Orleans PA-S   I discussed the limitations, risks, security and privacy concerns of performing an evaluation and management service by telephone and the availability of in person appointments. I also discussed with the patient that there may be a patient responsible charge related to this service. The patient expressed understanding and agreed to proceed.   History of Present Illness: Pt is a 51 yo female with ST, lost voice, dry cough, headache, sinus pressure, headache, congestion, right ear pain for about 5 days. Taking motrin and nyquil with little relief. Tested negative for covid. Has 2 covid vaccines. No labored breathing.   .. Active Ambulatory Problems    Diagnosis Date Noted   Tinnitus 06/05/2014   Anemia, iron deficiency 06/05/2014   Insomnia 06/05/2014   Lumbar degenerative disc disease 09/26/2014   Strain of left hip adductor muscle 09/26/2014   Plantar fasciitis, right 07/29/2016   Family history of premature coronary heart disease 07/03/2013   Radicular pain 10/15/2018   Impingement syndrome, shoulder, right 12/17/2018   Mass of ankle, left 07/26/2019   Adnexal mass 04/25/2019   Resolved Ambulatory Problems    Diagnosis Date Noted   Sinusitis 10/31/2014   Irregular periods 10/15/2018   Depressed mood 10/15/2018   Abnormal uterine bleeding (AUB) 04/25/2019   Past Medical History:  Diagnosis Date   Asthma    Bronchitis    Hyperlipidemia    Thyroid nodule        Observations/Objective: No acute distress Horse voice Productive cough No labored breathing  .[name] Today's Vitals   01/13/21 1422  Temp: 98 F (36.7 C)  TempSrc:  Oral  Weight: 164 lb (74.4 kg)  Height: 5\' 2"  (1.575 m)   Body mass index is 30 kg/m.    Assessment and Plan: Marland KitchenVickie was seen today for nasal congestion.  Diagnoses and all orders for this visit:  Laryngitis -     predniSONE (DELTASONE) 50 MG tablet; One tab PO daily for 5 days.  Sinobronchitis -     HYDROcodone bit-homatropine (HYCODAN) 5-1.5 MG/5ML syrup; Take 5 mLs by mouth every 6 (six) hours as needed for cough. -     amoxicillin (AMOXIL) 875 MG tablet; Take 1 tablet (875 mg total) by mouth 2 (two) times daily.  Negative for covid and has 2 covid vaccines Likely viral due to laryngitis Start with prednisone and hycodan only add amoxil after symptoms for at least 8 days.  Encouraged voice rest and honey lemon teas and cough drops.  Follow up as needed or if symptoms worsen.    Follow Up Instructions:    I discussed the assessment and treatment plan with the patient. The patient was provided an opportunity to ask questions and all were answered. The patient agreed with the plan and demonstrated an understanding of the instructions.   The patient was advised to call back or seek an in-person evaluation if the symptoms worsen or if the condition fails to improve as anticipated.  I provided 15 minutes of non-face-to-face time during this encounter.   [name], PA-C

## 2021-01-13 NOTE — Progress Notes (Signed)
Saturday AM Sore throat, getting worse Cough started last night - dry Headache, above right eye, congestion, right ear pain Loss of voice  Taking Motrin and Nyquil - not helping   Covid negative - Sunday

## 2021-01-15 ENCOUNTER — Telehealth: Payer: Self-pay | Admitting: Neurology

## 2021-01-15 NOTE — Telephone Encounter (Signed)
Patient left vm stating cough medication sent for her is not helping. It looks like Hycodan sent for patient.

## 2021-01-15 NOTE — Telephone Encounter (Signed)
Left message on machine for patient to call back to discuss cough.

## 2021-01-18 ENCOUNTER — Encounter: Payer: Self-pay | Admitting: Physician Assistant

## 2021-01-18 NOTE — Telephone Encounter (Signed)
Spoke with patient, she is feeling some better today. She started the antibiotics on Saturday. She is having a lot of muscular pain in abdomen due to coughing. She is using the Hycodan. I let her know that is really the strongest cough medication available. Please advise if she needs to do anything different?

## 2021-01-20 NOTE — Telephone Encounter (Signed)
Patient asked for a note for Friday and Monday for missing work. Note written.   She is still having a lot of abdominal pain due to coughing. I advised rest, heating pad. Any other suggestions?

## 2021-01-22 ENCOUNTER — Other Ambulatory Visit: Payer: Self-pay

## 2021-01-22 MED ORDER — BENZONATATE 200 MG PO CAPS: 200.0000 mg | ORAL_CAPSULE | Freq: Three times a day (TID) | ORAL | 0 refills | Status: DC

## 2021-02-12 ENCOUNTER — Other Ambulatory Visit: Payer: Self-pay | Admitting: Physician Assistant

## 2021-02-12 DIAGNOSIS — Z20828 Contact with and (suspected) exposure to other viral communicable diseases: Secondary | ICD-10-CM

## 2021-02-12 MED ORDER — OSELTAMIVIR PHOSPHATE 75 MG PO CAPS: 75.0000 mg | ORAL_CAPSULE | Freq: Every day | ORAL | 0 refills | Status: DC

## 2021-02-12 NOTE — Progress Notes (Signed)
Tamiflu sent.

## 2021-03-08 ENCOUNTER — Encounter: Payer: Self-pay | Admitting: Family Medicine

## 2021-03-08 ENCOUNTER — Ambulatory Visit: Payer: BC Managed Care – PPO | Admitting: Family Medicine

## 2021-03-08 ENCOUNTER — Other Ambulatory Visit: Payer: Self-pay

## 2021-03-08 VITALS — BP 127/75 | HR 75 | Resp 16 | Ht 62.0 in | Wt 164.0 lb

## 2021-03-08 DIAGNOSIS — J40 Bronchitis, not specified as acute or chronic: Secondary | ICD-10-CM

## 2021-03-08 DIAGNOSIS — J329 Chronic sinusitis, unspecified: Secondary | ICD-10-CM | POA: Diagnosis not present

## 2021-03-08 LAB — POCT INFLUENZA A/B
Influenza A, POC: NEGATIVE
Influenza B, POC: NEGATIVE

## 2021-03-08 MED ORDER — AZITHROMYCIN 250 MG PO TABS: ORAL_TABLET | ORAL | 0 refills | Status: AC

## 2021-03-08 MED ORDER — PREDNISONE 20 MG PO TABS: 40.0000 mg | ORAL_TABLET | Freq: Every day | ORAL | 0 refills | Status: DC

## 2021-03-08 MED ORDER — HYDROCODONE BIT-HOMATROP MBR 5-1.5 MG/5ML PO SOLN: 5.0000 mL | Freq: Every evening | ORAL | 0 refills | Status: DC | PRN

## 2021-03-08 NOTE — Progress Notes (Signed)
Acute Office Visit  Subjective:    Patient ID: Sheryl Taylor, female    DOB: 12/09/1970, 51 y.o.   MRN: [id number]  Chief Complaint  Patient presents with   Sinusitis    Teeth pain, productive cough, stuffy ears, facial pressure/pain for 4 days. Covid test negative on Saturday      HPI Patient is in today for cough and sinus congestion for 4 days.  She says cough is productive.  Ears feel full and stuffy.  She says her teeth were really hurting last night and she was having a lot of facial pain and pressure.  No sore throat in fact when it first started she actually just had body aches and ran a low-grade temperature around 100.5.  She has been using DayQuil and NyQuil.  Past Medical History:  Diagnosis Date   Asthma    Bronchitis    Hyperlipidemia    Thyroid nodule     Past Surgical History:  Procedure Laterality Date   CESAREAN SECTION     x 2    CHOLECYSTECTOMY  2003    Family History  Problem Relation Age of Onset   Lung cancer Father        lung (39)   Heart failure Father        (28)   Hyperlipidemia Father    CAD Father    Breast cancer Maternal Aunt    Heart attack Paternal Uncle        bypass surgery    Breast cancer Other        great aunt   Breast cancer Other        cousin    Social History   Socioeconomic History   Marital status: Married    Spouse name: Frederico Hamman    Number of children: 2   Years of education: Not on file   Highest education level: Not on file  Occupational History   Occupation: Control and instrumentation engineer    Comment: [email or web address]  Tobacco Use   Smoking status: Never   Smokeless tobacco: Never  Substance and Sexual Activity   Alcohol use: No    Alcohol/week: 0.0 standard drinks   Drug use: No   Sexual activity: Yes    Partners: Male  Other Topics Concern   Not on file  Social History Narrative   Patient works as a Corporate treasurer with [employer] she is married to [place]. And has two children. She exercises 2 days a wk for an 1 hour.  Her  sister died suddenty at 25 unknown cause and this makes her very anxious.     Social Determinants of Health   Financial Resource Strain: Not on file  Food Insecurity: Not on file  Transportation Needs: Not on file  Physical Activity: Not on file  Stress: Not on file  Social Connections: Not on file  Intimate Partner Violence: Not on file    Outpatient Medications Prior to Visit  Medication Sig Dispense Refill   albuterol (VENTOLIN HFA) 108 (90 Base) MCG/ACT inhaler Inhale 1-2 puffs into the lungs every 6 (six) hours as needed for wheezing or shortness of breath. 18 g 0   cetirizine (ZYRTEC) 10 MG tablet Take 10 mg by mouth daily.     fluticasone (FLONASE) 50 MCG/ACT nasal spray Place into the nose.     ibuprofen (ADVIL) 800 MG tablet Take 1 tablet (800 mg total) by mouth every 8 (eight) hours as needed. 30 tablet 2   PREVIDENT 5000 BOOSTER PLUS 1.1 %  PSTE USE ONCE DAILY IN PLACE OF REGULAR TOOTHPASTE     valACYclovir (VALTREX) 500 MG tablet Take 500 mg by mouth 2 (two) times daily.     amoxicillin (AMOXIL) 875 MG tablet Take 1 tablet (875 mg total) by mouth 2 (two) times daily. 20 tablet 0   benzonatate (TESSALON) 200 MG capsule Take 1 capsule (200 mg total) by mouth 3 (three) times daily. 30 capsule 0   oseltamivir (TAMIFLU) 75 MG capsule Take 1 capsule (75 mg total) by mouth daily. For 10 days for prevention. 10 capsule 0   HYDROcodone bit-homatropine (HYCODAN) 5-1.5 MG/5ML syrup Take 5 mLs by mouth every 6 (six) hours as needed for cough. 120 mL 0   predniSONE (DELTASONE) 50 MG tablet One tab PO daily for 5 days. 5 tablet 0   No facility-administered medications prior to visit.    Allergies  Allergen Reactions   Codeine Phosphate Itching   Buprenorphine Hcl Rash and Swelling   Morphine And Related Swelling and Rash    Review of Systems     Objective:    Physical Exam Constitutional:      Appearance: She is well-developed.  HENT:     Head: Normocephalic and atraumatic.      Right Ear: External ear normal.     Left Ear: External ear normal.     Nose: Nose normal.  Eyes:     Conjunctiva/sclera: Conjunctivae normal.     Pupils: Pupils are equal, round, and reactive to light.  Neck:     Thyroid: No thyromegaly.  Cardiovascular:     Rate and Rhythm: Normal rate and regular rhythm.     Heart sounds: Normal heart sounds.  Pulmonary:     Effort: Pulmonary effort is normal.     Breath sounds: Wheezing present.     Comments: Diffuse rhonchi with wheezing at the bases bilaterally.  Improved significantly after a couple of deep coughs. Musculoskeletal:     Cervical back: Neck supple.  Lymphadenopathy:     Cervical: No cervical adenopathy.  Skin:    General: Skin is warm and dry.  Neurological:     Mental Status: She is alert and oriented to person, place, and time.    BP 127/75    Pulse 75    Resp 16    Ht 5\' 2"  (1.575 m)    Wt 164 lb (74.4 kg)    SpO2 97%    BMI 30.00 kg/m  Wt Readings from Last 3 Encounters:  03/08/21 164 lb (74.4 kg)  01/13/21 164 lb (74.4 kg)  12/25/20 164 lb (74.4 kg)    There are no preventive care reminders to display for this patient.  There are no preventive care reminders to display for this patient.   Lab Results  Component Value Date   TSH 1.81 07/22/2020   Lab Results  Component Value Date   WBC 5.5 07/22/2020   HGB 12.4 07/22/2020   HCT 39 07/22/2020   MCV 84.2 08/12/2019   PLT 309 07/22/2020   Lab Results  Component Value Date   NA 143 07/22/2020   K 4.4 07/22/2020   CO2 22 07/22/2020   GLUCOSE 102 (H) 08/12/2019   BUN 19 07/22/2020   CREATININE 0.9 07/22/2020   BILITOT 0.4 08/12/2019   ALKPHOS 84 07/22/2020   AST 12 (A) 07/22/2020   ALT 12 07/22/2020   PROT 6.6 08/12/2019   ALBUMIN 4.1 07/22/2020   CALCIUM 9.0 07/22/2020   Lab Results  Component Value Date  CHOL 157 07/22/2020   Lab Results  Component Value Date   HDL 45 07/22/2020   Lab Results  Component Value Date   LDLCALC 100  07/22/2020   Lab Results  Component Value Date   TRIG 62 07/22/2020   Lab Results  Component Value Date   CHOLHDL 3.6 08/12/2019   No results found for: HGBA1C     Assessment & Plan:   Problem List Items Addressed This Visit   None Visit Diagnoses     Sinobronchitis    -  Primary   Relevant Medications   predniSONE (DELTASONE) 20 MG tablet   HYDROcodone bit-homatropine (HYCODAN) 5-1.5 MG/5ML syrup   azithromycin (ZITHROMAX) 250 MG tablet   Other Relevant Orders   POCT Influenza A/B (Completed)      She reports home negative COVID test.  We did do a flu swab here today especially since the symptoms started with body aches and fever.  If negative will treat for sinobronchitis with prednisone.  Negative for Flu.    Meds ordered this encounter  Medications   predniSONE (DELTASONE) 20 MG tablet    Sig: Take 2 tablets (40 mg total) by mouth daily with breakfast.    Dispense:  10 tablet    Refill:  0   HYDROcodone bit-homatropine (HYCODAN) 5-1.5 MG/5ML syrup    Sig: Take 5-10 mLs by mouth at bedtime as needed for cough.    Dispense:  60 mL    Refill:  0   azithromycin (ZITHROMAX) 250 MG tablet    Sig: 2 Ttabs PO on Day 1, then one a day x 4 days.    Dispense:  6 tablet    Refill:  0     Beatrice Lecher, MD

## 2021-03-10 ENCOUNTER — Encounter: Payer: Self-pay | Admitting: Family Medicine

## 2021-03-18 ENCOUNTER — Encounter: Payer: Self-pay | Admitting: Family Medicine

## 2021-03-19 MED ORDER — PREDNISONE 20 MG PO TABS: 40.0000 mg | ORAL_TABLET | Freq: Every day | ORAL | 0 refills | Status: DC

## 2021-03-19 NOTE — Telephone Encounter (Signed)
Meds ordered this encounter  ?Medications  ? predniSONE (DELTASONE) 20 MG tablet  ?  Sig: Take 2 tablets (40 mg total) by mouth daily with breakfast.  ?  Dispense:  10 tablet  ?  Refill:  0  ? ? ?If not better in 1 week we can refer to ENT. ?

## 2021-05-21 ENCOUNTER — Emergency Department (INDEPENDENT_AMBULATORY_CARE_PROVIDER_SITE_OTHER)
Admission: RE | Admit: 2021-05-21 | Discharge: 2021-05-21 | Disposition: A | Discharge status: Home / Self Care | Payer: BC Managed Care – PPO | Source: Ambulatory Visit | Attending: Family Medicine | Admitting: Family Medicine

## 2021-05-21 ENCOUNTER — Emergency Department (INDEPENDENT_AMBULATORY_CARE_PROVIDER_SITE_OTHER): Payer: BC Managed Care – PPO

## 2021-05-21 VITALS — BP 115/76 | HR 76 | Temp 98.3°F | Resp 18

## 2021-05-21 DIAGNOSIS — R1032 Left lower quadrant pain: Secondary | ICD-10-CM

## 2021-05-21 DIAGNOSIS — R1012 Left upper quadrant pain: Secondary | ICD-10-CM | POA: Diagnosis not present

## 2021-05-21 LAB — POCT URINALYSIS DIP (MANUAL ENTRY)
Bilirubin, UA: NEGATIVE
Blood, UA: NEGATIVE
Glucose, UA: NEGATIVE mg/dL
Leukocytes, UA: NEGATIVE
Nitrite, UA: NEGATIVE
Protein Ur, POC: NEGATIVE mg/dL
Spec Grav, UA: 1.02 (ref 1.010–1.025)
Urobilinogen, UA: 0.2 E.U./dL
pH, UA: 6 (ref 5.0–8.0)

## 2021-05-21 NOTE — Discharge Instructions (Signed)
Begin clear liquids for about 24 hours, then may begin a [employer] (Bananas, Rice, Applesauce, Toast) for about 18 to 24 hours. Then gradually advance to a regular diet as tolerated.   ?  ?If symptoms become significantly worse during the night or over the weekend, proceed to the local emergency room. ? ?

## 2021-05-21 NOTE — ED Triage Notes (Signed)
Pt c/o LLQ pain since yesterday. Pain is a dull ache, sometimes sharp. Nothing to eat today. Denies N/V/D. No urinary sxs. Pain 7/10 ?

## 2021-05-21 NOTE — ED Provider Notes (Signed)
?[facility] ? ? ? ?CSN: [id number] ?Arrival date & time: 05/21/21  1642 ? ? ?  ? ?History   ?Chief Complaint ?Chief Complaint  ?Patient presents with  ? Abdominal Pain  ? ? ?HPI ?Sheryl Taylor is a 51 y.o. female.  ? ?While sitting at her desk yesterday patient suddenly experienced colicky left abdominal pain that has persisted.  She describes the pain as a dull ache, sometimes sharp, and constant.  She denies nausea/vomiting but has had decreased appetite.  She denies urinary symptoms and vaginal discharge.  She report that she normally has one bowel movement per week, but had two normal bowel movements today without pain. ?Patient was diagnosed with a complex left ovarian cyst on 02/13/19.  She subsequently underwent operative laparoscopy on 04/25/19 with left oophorectomy, bilateral salpingectomies, and operative hysteroscopy with D & C. ? ?The history is provided by the patient.  ?Abdominal Pain ?Pain location:  LUQ and LLQ ?Pain quality: cramping, dull and sharp   ?Pain radiates to:  Does not radiate ?Pain severity:  Moderate ?Onset quality:  Sudden ?Duration:  1 day ?Timing:  Constant ?Progression:  Unchanged ?Chronicity:  New ?Context: previous surgery   ?Context: not eating and not recent illness   ?Relieved by:  None tried ?Worsened by:  Palpation and movement ?Ineffective treatments:  None tried ?Associated symptoms: anorexia   ?Associated symptoms: no belching, no chest pain, no chills, no constipation, no diarrhea, no dysuria, no fatigue, no fever, no hematemesis, no hematochezia, no hematuria, no melena, no nausea, no vaginal bleeding and no vomiting   ? ?Past Medical History:  ?Diagnosis Date  ? Asthma   ? Bronchitis   ? Hyperlipidemia   ? Thyroid nodule   ? ? ?Patient Active Problem List  ? Diagnosis Date Noted  ? Mass of ankle, left 07/26/2019  ? Adnexal mass 04/25/2019  ? Impingement syndrome, shoulder, right 12/17/2018  ? Radicular pain 10/15/2018  ? Plantar fasciitis, right 07/29/2016  ?  Lumbar degenerative disc disease 09/26/2014  ? Strain of left hip adductor muscle 09/26/2014  ? Tinnitus 06/05/2014  ? Anemia, iron deficiency 06/05/2014  ? Insomnia 06/05/2014  ? Family history of premature coronary heart disease 07/03/2013  ? ? ?Past Surgical History:  ?Procedure Laterality Date  ? CESAREAN SECTION    ? x 2   ? CHOLECYSTECTOMY  2003  ? ? ?OB History   ?No obstetric history on file. ?  ? ? ? ?Home Medications   ? ?Prior to Admission medications   ?Medication Sig Start Date End Date Taking? Authorizing Provider  ?albuterol (VENTOLIN HFA) 108 (90 Base) MCG/ACT inhaler Inhale 1-2 puffs into the lungs every 6 (six) hours as needed for wheezing or shortness of breath. 10/11/20   Raylene Everts, MD  ?cetirizine (ZYRTEC) 10 MG tablet Take 10 mg by mouth daily.    [provider]  ?fluticasone (FLONASE) 50 MCG/ACT nasal spray Place into the nose.    [provider]  ?HYDROcodone bit-homatropine (HYCODAN) 5-1.5 MG/5ML syrup Take 5-10 mLs by mouth at bedtime as needed for cough. 03/08/21   Hali Marry, MD  ?ibuprofen (ADVIL) 800 MG tablet Take 1 tablet (800 mg total) by mouth every 8 (eight) hours as needed. 01/28/19   Hali Marry, MD  ?predniSONE (DELTASONE) 20 MG tablet Take 2 tablets (40 mg total) by mouth daily with breakfast. 03/19/21   Hali Marry, MD  ?PREVIDENT 5000 BOOSTER PLUS 1.1 % PSTE USE ONCE DAILY IN PLACE OF  REGULAR TOOTHPASTE 01/07/19   [provider]  ?valACYclovir (VALTREX) 500 MG tablet Take 500 mg by mouth 2 (two) times daily. 12/23/20   [provider]  ? ? ?Family History ?Family History  ?Problem Relation Age of Onset  ? Lung cancer Father   ?     lung (39)  ? Heart failure Father   ?     (41)  ? Hyperlipidemia Father   ? CAD Father   ? Breast cancer Maternal Aunt   ? Heart attack Paternal Uncle   ?     bypass surgery   ? Breast cancer Other   ?     great aunt  ? Breast cancer Other   ?     cousin  ? ? ?Social  History ?Social History  ? ?Tobacco Use  ? Smoking status: Never  ? Smokeless tobacco: Never  ?Substance Use Topics  ? Alcohol use: No  ?  Alcohol/week: 0.0 standard drinks  ? Drug use: No  ? ? ? ?Allergies   ?Codeine phosphate, Buprenorphine hcl, and Morphine and related ? ? ?Review of Systems ?Review of Systems  ?Constitutional:  Positive for appetite change. Negative for chills, diaphoresis, fatigue and fever.  ?Cardiovascular:  Negative for chest pain.  ?Gastrointestinal:  Positive for abdominal pain and anorexia. Negative for abdominal distention, blood in stool, constipation, diarrhea, hematemesis, hematochezia, melena, nausea and vomiting.  ?Genitourinary:  Negative for dysuria, flank pain, frequency, hematuria and vaginal bleeding.  ?All other systems reviewed and are negative. ? ? ?Physical Exam ?Triage Vital Signs ?ED Triage Vitals  ?Enc Vitals Group  ?   BP 05/21/21 1653 115/76  ?   Pulse Rate 05/21/21 1653 76  ?   Resp 05/21/21 1653 18  ?   Temp 05/21/21 1653 98.3 ?F (36.8 ?C)  ?   Temp Source 05/21/21 1653 Oral  ?   SpO2 05/21/21 1653 98 %  ?   Weight --   ?   Height --   ?   Head Circumference --   ?   Peak Flow --   ?   Pain Score 05/21/21 1654 7  ?   Pain Loc --   ?   Pain Edu? --   ?   Excl. in [place]? --   ? ?No data found. ? ?Updated Vital Signs ?BP 115/76 (BP Location: Right Arm)   Pulse 76   Temp 98.3 ?F (36.8 ?C) (Oral)   Resp 18   SpO2 98%  ? ?Visual Acuity ?Right Eye Distance:   ?Left Eye Distance:   ?Bilateral Distance:   ? ?Right Eye Near:   ?Left Eye Near:    ?Bilateral Near:    ? ?Physical Exam ?Vitals and nursing note reviewed.  ?Constitutional:   ?   General: She is not in acute distress. ?HENT:  ?   Head: Normocephalic.  ?   Mouth/Throat:  ?   Mouth: Mucous membranes are moist.  ?   Pharynx: Oropharynx is clear.  ?Eyes:  ?   Extraocular Movements: Extraocular movements intact.  ?Cardiovascular:  ?   Rate and Rhythm: Normal rate and regular rhythm.  ?   Heart sounds: Normal heart  sounds.  ?Pulmonary:  ?   Breath sounds: Normal breath sounds.  ?Abdominal:  ?   General: Abdomen is protuberant. Bowel sounds are normal. There is no distension.  ?   Palpations: Abdomen is soft. There is no hepatomegaly or splenomegaly.  ?   Tenderness: There is abdominal  tenderness in the left upper quadrant and left lower quadrant. There is no right CVA tenderness, left CVA tenderness, guarding or rebound.  ?   Hernia: There is no hernia in the umbilical area or ventral area.  ? ? ?Neurological:  ?   Mental Status: She is alert.  ? ? ?UC Treatments / Results  ?Labs ?(all labs ordered are listed, but only abnormal results are displayed) ?Labs Reviewed  ?POCT URINALYSIS DIP (MANUAL ENTRY) - Abnormal; Notable for the following components:  ?    Result Value  ? Ketones, POC UA trace (5) (*)   ? All other components within normal limits  ?CBC WITH DIFFERENTIAL/PLATELET  ?COMPLETE METABOLIC PANEL WITH GFR  ?AMYLASE  ?LIPASE  ? ? ?EKG ? ? ?Radiology ?DG Abd 2 Views ? ?Result Date: 05/21/2021 ?CLINICAL DATA:  Left lower quadrant abdominal pain. EXAM: ABDOMEN - 2 VIEW COMPARISON:  None Available. FINDINGS: Gas and an ordinary amount of stool are present in the colon. There is a paucity of small bowel gas. No dilated loops of bowel are seen to suggest obstruction. Right upper quadrant abdominal surgical clips are noted. The visualized lung bases are grossly clear. No acute osseous abnormality is identified. IMPRESSION: Negative. Electronically Signed   By: Logan Bores M.D.   On: 05/21/2021 17:42   ? ?Procedures ?Procedures (including critical care time) ? ?Medications Ordered in UC ?Medications - No data to display ? ?Initial Impression / Assessment and Plan / UC Course  ?I have reviewed the triage vital signs and the nursing notes. ? ?Pertinent labs & imaging results that were available during my care of the patient were reviewed by me and considered in my medical decision making (see chart for details). ? ?  ?Negative  abdominal x-rays reassuring. ??constipation. ?early diverticulitis. ?Begin clear liquids.  CBC, CMP, amylase, lipase pending. ?Followup with Dr. Madilyn Fireman in 3 days. ? ?Final Clinical Impressions(s) / UC Diagnoses  ? ?Final

## 2021-05-22 LAB — COMPLETE METABOLIC PANEL WITH GFR
AG Ratio: 1.7 (calc) (ref 1.0–2.5)
ALT: 14 U/L (ref 6–29)
AST: 13 U/L (ref 10–35)
Albumin: 4.3 g/dL (ref 3.6–5.1)
Alkaline phosphatase (APISO): 79 U/L (ref 31–125)
BUN: 8 mg/dL (ref 7–25)
CO2: 25 mmol/L (ref 20–32)
Calcium: 9.3 mg/dL (ref 8.6–10.2)
Chloride: 107 mmol/L (ref 98–110)
Creat: 0.75 mg/dL (ref 0.50–0.99)
Globulin: 2.5 g/dL (calc) (ref 1.9–3.7)
Glucose, Bld: 95 mg/dL (ref 65–99)
Potassium: 4.3 mmol/L (ref 3.5–5.3)
Sodium: 141 mmol/L (ref 135–146)
Total Bilirubin: 0.5 mg/dL (ref 0.2–1.2)
Total Protein: 6.8 g/dL (ref 6.1–8.1)
eGFR: 98 mL/min/{1.73_m2} (ref >=60)

## 2021-05-22 LAB — CBC WITH DIFFERENTIAL/PLATELET
Absolute Monocytes: 499 cells/uL (ref 200–950)
Basophils Absolute: 31 cells/uL (ref 0–200)
Basophils Relative: 0.4 %
Eosinophils Absolute: 78 cells/uL (ref 15–500)
Eosinophils Relative: 1 %
HCT: 41.5 % (ref 35.0–45.0)
Hemoglobin: 13.6 g/dL (ref 11.7–15.5)
Lymphs Abs: 2145 cells/uL (ref 850–3900)
MCH: 29 pg (ref 27.0–33.0)
MCHC: 32.8 g/dL (ref 32.0–36.0)
MCV: 88.5 fL (ref 80.0–100.0)
MPV: 10.4 fL (ref 7.5–12.5)
Monocytes Relative: 6.4 %
Neutro Abs: 5047 cells/uL (ref 1500–7800)
Neutrophils Relative %: 64.7 %
Platelets: 341 10*3/uL (ref 140–400)
RBC: 4.69 10*6/uL (ref 3.80–5.10)
RDW: 13.1 % (ref 11.0–15.0)
Total Lymphocyte: 27.5 %
WBC: 7.8 10*3/uL (ref 3.8–10.8)

## 2021-05-22 LAB — AMYLASE: Amylase: 25 U/L (ref 21–101)

## 2021-05-22 LAB — LIPASE: Lipase: 10 U/L (ref 7–60)

## 2021-07-29 LAB — LAB REPORT - SCANNED: Free T4: 1.1

## 2021-07-29 LAB — TSH: TSH: 4.86 (ref 0.41–5.90)

## 2021-08-05 LAB — HM MAMMOGRAPHY

## 2021-08-16 ENCOUNTER — Encounter: Payer: BC Managed Care – PPO | Admitting: Family Medicine

## 2021-08-18 ENCOUNTER — Ambulatory Visit: Payer: BC Managed Care – PPO | Admitting: Family Medicine

## 2021-08-18 ENCOUNTER — Encounter: Payer: Self-pay | Admitting: Family Medicine

## 2021-08-18 VITALS — BP 120/80 | HR 79 | Temp 98.2°F | Ht 61.0 in | Wt 163.0 lb

## 2021-08-18 DIAGNOSIS — J40 Bronchitis, not specified as acute or chronic: Secondary | ICD-10-CM | POA: Diagnosis not present

## 2021-08-18 DIAGNOSIS — J0100 Acute maxillary sinusitis, unspecified: Secondary | ICD-10-CM | POA: Diagnosis not present

## 2021-08-18 DIAGNOSIS — J329 Chronic sinusitis, unspecified: Secondary | ICD-10-CM

## 2021-08-18 MED ORDER — HYDROCODONE BIT-HOMATROP MBR 5-1.5 MG/5ML PO SOLN: 5.0000 mL | Freq: Every evening | ORAL | 0 refills | Status: DC | PRN

## 2021-08-18 MED ORDER — AMOXICILLIN-POT CLAVULANATE 875-125 MG PO TABS: 1.0000 | ORAL_TABLET | Freq: Two times a day (BID) | ORAL | 0 refills | Status: DC

## 2021-08-18 NOTE — Progress Notes (Signed)
Acute Office Visit  Subjective:     Patient ID: Sheryl Taylor, female    DOB: 07/01/1970, 51 y.o.   MRN: [id number]  Chief Complaint  Patient presents with   Cough    Pt/ c/o sinus congestion  back pain, facial pain and cough    HPI Patient is in today for sinus congestion facial pressure and pain and cough for about 8 days.  She says it is just not getting better she has been taking DayQuil, NyQuil and Tylenol sinus.  She is getting some sputum with the cough.  She is also waking up with crusting in her eyes in the morning.  No fevers or chills.  She is getting some popping in her ears.  Her son was sick first and her husband is also been sick.  ROS      Objective:    BP 120/80   Pulse 79   Temp 98.2 F (36.8 C) (Oral)   Ht 5\' 1"  (1.549 m)   Wt 163 lb (73.9 kg)   SpO2 99%   BMI 30.80 kg/m    Physical Exam Constitutional:      Appearance: She is well-developed.  HENT:     Head: Normocephalic and atraumatic.     Right Ear: Tympanic membrane, ear canal and external ear normal.     Left Ear: Tympanic membrane, ear canal and external ear normal.     Nose: Nose normal.     Mouth/Throat:     Pharynx: Oropharynx is clear.  Eyes:     Conjunctiva/sclera: Conjunctivae normal.     Pupils: Pupils are equal, round, and reactive to light.  Neck:     Thyroid: No thyromegaly.  Cardiovascular:     Rate and Rhythm: Normal rate and regular rhythm.     Heart sounds: Normal heart sounds.  Pulmonary:     Effort: Pulmonary effort is normal.     Breath sounds: No wheezing.     Comments: Coarse breath sounds at the bases bilaterally. Musculoskeletal:     Cervical back: Neck supple.  Lymphadenopathy:     Cervical: No cervical adenopathy.  Skin:    General: Skin is warm and dry.  Neurological:     Mental Status: She is alert and oriented to person, place, and time.     No results found for any visits on 08/18/21.      Assessment & Plan:   Problem List Items Addressed This  Visit   None Visit Diagnoses     Acute non-recurrent maxillary sinusitis    -  Primary   Relevant Medications   amoxicillin-clavulanate (AUGMENTIN) 875-125 MG tablet   HYDROcodone bit-homatropine (HYCODAN) 5-1.5 MG/5ML syrup   Sinobronchitis       Relevant Medications   amoxicillin-clavulanate (AUGMENTIN) 875-125 MG tablet   HYDROcodone bit-homatropine (HYCODAN) 5-1.5 MG/5ML syrup      Symptoms for 8 days not improving.  Will go ahead and treat with Augmentin.  Call if not better in 1 week.  Cough syrup given for bedtime use only.  Meds ordered this encounter  Medications   amoxicillin-clavulanate (AUGMENTIN) 875-125 MG tablet    Sig: Take 1 tablet by mouth 2 (two) times daily.    Dispense:  14 tablet    Refill:  0   HYDROcodone bit-homatropine (HYCODAN) 5-1.5 MG/5ML syrup    Sig: Take 5-10 mLs by mouth at bedtime as needed for cough.    Dispense:  60 mL    Refill:  0  Return if symptoms worsen or fail to improve.  Nani Gasser, MD

## 2021-08-24 ENCOUNTER — Encounter: Payer: BC Managed Care – PPO | Admitting: Family Medicine

## 2021-08-24 ENCOUNTER — Encounter: Payer: Self-pay | Admitting: Family Medicine

## 2021-08-24 ENCOUNTER — Ambulatory Visit (INDEPENDENT_AMBULATORY_CARE_PROVIDER_SITE_OTHER): Payer: BC Managed Care – PPO | Admitting: Family Medicine

## 2021-08-24 VITALS — BP 110/53 | HR 75 | Ht 61.0 in | Wt 162.0 lb

## 2021-08-24 DIAGNOSIS — Z0000 Encounter for general adult medical examination without abnormal findings: Secondary | ICD-10-CM | POA: Diagnosis not present

## 2021-08-24 NOTE — Progress Notes (Signed)
Complete physical exam  Patient: Genese Quebedeaux   DOB: October 04, 1970   51 y.o. Female  MRN: [id number]  Subjective:    Chief Complaint  Patient presents with   Annual Exam    Stepfanie Yott is a 51 y.o. female who presents today for a complete physical exam. She reports consuming a general diet.  Walks 6000 step per day at work.   She generally feels fairly well.  Though she is really been struggling with some significant fatigue.  She is also noticed some cold intolerance though this seems to be more long-term.  And she has noticed some soreness and tenderness in her legs.  . She does not have additional problems to discuss today.   Recently went to see her OB/GYN and they did routine labs with her wellness exam.  Her TSH was elevated in the upper 4 range and she was started on levothyroxine 50 mcg daily.   Most recent fall risk assessment:    08/24/2021    2:18 PM  [place] in the past year? 0  Number falls in past yr: 0  Injury with Fall? 0     Most recent depression screenings:    08/24/2021    2:17 PM 08/24/2021    2:01 PM  PHQ 2/9 Scores  PHQ - 2 Score 0 0         Patient Care Team: Hali Marry, MD as PCP - General (Family Medicine)   Outpatient Medications Prior to Visit  Medication Sig   albuterol (VENTOLIN HFA) 108 (90 Base) MCG/ACT inhaler Inhale 1-2 puffs into the lungs every 6 (six) hours as needed for wheezing or shortness of breath.   cetirizine (ZYRTEC) 10 MG tablet Take 10 mg by mouth daily.   fluticasone (FLONASE) 50 MCG/ACT nasal spray Place into the nose.   ibuprofen (ADVIL) 800 MG tablet Take 1 tablet (800 mg total) by mouth every 8 (eight) hours as needed.   levothyroxine (SYNTHROID) 50 MCG tablet Take 50 mcg by mouth daily.   PREVIDENT 5000 BOOSTER PLUS 1.1 % PSTE USE ONCE DAILY IN PLACE OF REGULAR TOOTHPASTE   valACYclovir (VALTREX) 500 MG tablet Take 500 mg by mouth 2 (two) times daily.   [DISCONTINUED] amoxicillin-clavulanate  (AUGMENTIN) 875-125 MG tablet Take 1 tablet by mouth 2 (two) times daily.   [DISCONTINUED] HYDROcodone bit-homatropine (HYCODAN) 5-1.5 MG/5ML syrup Take 5-10 mLs by mouth at bedtime as needed for cough.   [DISCONTINUED] predniSONE (DELTASONE) 20 MG tablet Take 2 tablets (40 mg total) by mouth daily with breakfast.   No facility-administered medications prior to visit.    ROS        Objective:     BP (!) 110/53   Pulse 75   Ht '5\' 1"'  (1.549 m)   Wt 162 lb (73.5 kg)   SpO2 99%   BMI 30.61 kg/m     Physical Exam Vitals and nursing note reviewed.  Constitutional:      Appearance: She is well-developed.  HENT:     Head: Normocephalic and atraumatic.     Right Ear: External ear normal.     Left Ear: External ear normal.     Nose: Nose normal.  Eyes:     Conjunctiva/sclera: Conjunctivae normal.     Pupils: Pupils are equal, round, and reactive to light.  Neck:     Thyroid: No thyromegaly.  Cardiovascular:     Rate and Rhythm: Normal rate and regular rhythm.  Heart sounds: Normal heart sounds.  Pulmonary:     Effort: Pulmonary effort is normal.     Breath sounds: Normal breath sounds. No wheezing.  Abdominal:     General: Bowel sounds are normal. There is no distension.     Palpations: Abdomen is soft.  Musculoskeletal:     Cervical back: Neck supple.  Lymphadenopathy:     Cervical: No cervical adenopathy.  Skin:    General: Skin is warm and dry.  Neurological:     General: No focal deficit present.     Mental Status: She is alert and oriented to person, place, and time.  Psychiatric:        Mood and Affect: Mood normal.        Behavior: Behavior normal.      No results found for any visits on 08/24/21.      Assessment & Plan:    Routine Health Maintenance and Physical Exam  Immunization History  Administered Date(s) Administered   PFIZER(Purple Top)SARS-COV-2 Vaccination 10/13/2019, 11/03/2019   Tdap 10/02/2006, 08/12/2019    Health Maintenance   Topic Date Due   COLONOSCOPY (Pts 45-28yr Insurance coverage will need to be confirmed)  Never done   COVID-19 Vaccine (3 - Pfizer series) 01/10/2022 (Originally 12/29/2019)   INFLUENZA VACCINE  01/10/2024 (Originally 08/10/2021)   MAMMOGRAM  08/06/2022   PAP SMEAR-Modifier  07/22/2025   TETANUS/TDAP  08/11/2029   Hepatitis C Screening  Completed   HIV Screening  Completed   Pneumococcal Vaccine 19643Years old  Aged Out   HPV VACCINES  Aged Out    Discussed health benefits of physical activity, and encouraged her to engage in regular exercise appropriate for her age and condition.  Problem List Items Addressed This Visit   None Visit Diagnoses     Wellness examination    -  Primary      No follow-ups on file.  Keep up a regular exercise program and make sure you are eating a healthy diet Try to eat 4 servings of dairy a day, or if you are lactose intolerant take a calcium with vitamin D daily.  Your vaccines are up to date.  She has received her Cologuard kit so just encouraged her to complete it.  We will try to get copy of recent labs done by OB/GYN. Continue to work on daily exercise.      CBeatrice Lecher MD

## 2021-08-25 ENCOUNTER — Encounter: Payer: Self-pay | Admitting: Family Medicine

## 2021-08-27 NOTE — Telephone Encounter (Signed)
I haven't seen it. Can you check my basket?  I won't be back until 1 today.  I just need her TSH that they drew.  Thank you

## 2021-09-03 ENCOUNTER — Telehealth: Payer: Self-pay | Admitting: Family Medicine

## 2021-09-03 DIAGNOSIS — E038 Other specified hypothyroidism: Secondary | ICD-10-CM | POA: Insufficient documentation

## 2021-09-03 NOTE — Telephone Encounter (Signed)
  Called and left message on cell phone in regards to the TSH that was drawn at the OB/GYN office.  TSH was 4.8 and free T4 was 1.1.  This would fall into the subclinical hypothyroid range.

## 2021-09-04 ENCOUNTER — Encounter: Payer: Self-pay | Admitting: Family Medicine

## 2021-09-07 MED ORDER — HYDROCODONE BIT-HOMATROP MBR 5-1.5 MG/5ML PO SOLN: 5.0000 mL | Freq: Every day | ORAL | 0 refills | Status: DC

## 2021-09-07 NOTE — Telephone Encounter (Signed)
Cough med sent.  See if can take decongestant like Sudafed.  We can put her on for virtual  please have her use flonase or nasonex too.   Meds ordered this encounter  Medications   HYDROcodone bit-homatropine (HYCODAN) 5-1.5 MG/5ML syrup    Sig: Take 5 mLs by mouth at bedtime.    Dispense:  60 mL    Refill:  0

## 2021-09-14 MED ORDER — PREDNISONE 20 MG PO TABS: 40.0000 mg | ORAL_TABLET | Freq: Every day | ORAL | 0 refills | Status: DC

## 2021-09-25 ENCOUNTER — Encounter: Payer: Self-pay | Admitting: Family Medicine

## 2021-10-07 ENCOUNTER — Other Ambulatory Visit: Payer: Self-pay | Admitting: *Deleted

## 2021-10-07 MED ORDER — FLUCONAZOLE 150 MG PO TABS: 150.0000 mg | ORAL_TABLET | Freq: Every day | ORAL | 1 refills | Status: DC

## 2021-11-29 ENCOUNTER — Encounter: Payer: Self-pay | Admitting: Medical-Surgical

## 2021-11-29 ENCOUNTER — Telehealth (INDEPENDENT_AMBULATORY_CARE_PROVIDER_SITE_OTHER): Payer: BC Managed Care – PPO | Admitting: Medical-Surgical

## 2021-11-29 ENCOUNTER — Telehealth: Payer: BC Managed Care – PPO | Admitting: Medical-Surgical

## 2021-11-29 DIAGNOSIS — J40 Bronchitis, not specified as acute or chronic: Secondary | ICD-10-CM | POA: Diagnosis not present

## 2021-11-29 DIAGNOSIS — J329 Chronic sinusitis, unspecified: Secondary | ICD-10-CM | POA: Diagnosis not present

## 2021-11-29 MED ORDER — IPRATROPIUM BROMIDE 0.03 % NA SOLN: 2.0000 | Freq: Two times a day (BID) | NASAL | 0 refills | Status: DC

## 2021-11-29 MED ORDER — HYDROCODONE BIT-HOMATROP MBR 5-1.5 MG/5ML PO SOLN: 5.0000 mL | Freq: Three times a day (TID) | ORAL | 0 refills | Status: DC | PRN

## 2021-11-29 MED ORDER — AZITHROMYCIN 250 MG PO TABS: ORAL_TABLET | ORAL | 0 refills | Status: AC

## 2021-11-29 MED ORDER — PREDNISONE 50 MG PO TABS: 50.0000 mg | ORAL_TABLET | Freq: Every day | ORAL | 0 refills | Status: DC

## 2021-11-29 NOTE — Progress Notes (Signed)
Since Friday cough, sore nose, yellow mucus, chest congestion, face/teeth hurt, not sleeping. Tried Nyquil/Dayquil with no relief. The benzonatate pills don't help her. COVID negative Saturday.

## 2021-11-29 NOTE — Progress Notes (Signed)
Virtual Visit via Telephone   I connected with  [name]  on 11/29/21 by telephone/telehealth and verified that I am speaking with the correct person using two identifiers.   I discussed the limitations, risks, security and privacy concerns of performing an evaluation and management service by telephone, including the higher likelihood of inaccurate diagnosis and treatment, and the availability of in person appointments.  We also discussed the likely need of an additional face to face encounter for complete and high quality delivery of care.  I also discussed with the patient that there may be a patient responsible charge related to this service. The patient expressed understanding and wishes to proceed.  Provider location is in medical facility. Patient location is at their home, different from provider location. People involved in care of the patient during this telehealth encounter were myself, my nurse/medical assistant, and my front office/scheduling team member.  CC: URI symptoms  HPI: Pleasant 51 year old female presenting via telephone after unsuccessfully attempting to connect via video visit.  After multiple attempts at troubleshooting, the visit was switched to a visit with patient's permission.  She reports that she has had upper respiratory symptoms that started on Friday with a bit of a cough, sore throat, and sinus/chest congestion.  She began to cough up small amounts of yellow mucus.  Her symptoms have progressed over the weekend and now she reports that her face and teeth hurt.  She is having difficulty sleeping at night.  Has quite a bit of postnasal drip and none of her current medications are helping.  She has been taking NyQuil and DayQuil as well as Tylenol Cold but these have not provided any relief for her.  She COVID tested at home on Saturday with negative results and again last night with negative results.  Recently exposed to her daughter who has been ill with similar  symptoms.  Review of Systems: See HPI for pertinent positives and negatives.   Objective Findings:    General: Speaking full sentences, no audible heavy breathing.  Sounds alert and appropriately interactive.    Impression and Recommendations:    1. Sinobronchitis Treating with prednisone burst, as needed Atrovent nasal spray, and as needed Hycodan cough syrup.  Since we are at the Thanksgiving holiday, sending in azithromycin to start on Wednesday if no improvement of her symptoms with the above treatment.  Advised that this is likely a viral issue at 3 to 4 days of symptoms and she should continue conservative measures at home with increased rest.  Work note sent via MyChart. - HYDROcodone bit-homatropine (HYCODAN) 5-1.5 MG/5ML syrup; Take 5 mLs by mouth every 8 (eight) hours as needed for cough.  Dispense: 60 mL; Refill: 0 - predniSONE (DELTASONE) 50 MG tablet; Take 1 tablet (50 mg total) by mouth daily.  Dispense: 5 tablet; Refill: 0 - ipratropium (ATROVENT) 0.03 % nasal spray; Place 2 sprays into both nostrils every 12 (twelve) hours.  Dispense: 30 mL; Refill: 0 - azithromycin (ZITHROMAX) 250 MG tablet; Take 2 tablets on day 1, then 1 tablet daily on days 2 through 5  Dispense: 6 tablet; Refill: 0  I discussed the above assessment and treatment plan with the patient. The patient was provided an opportunity to ask questions and all were answered. The patient agreed with the plan and demonstrated an understanding of the instructions.   The patient was advised to call back or seek an in-person evaluation if the symptoms worsen or if the condition fails to improve as anticipated.  20 minutes of non-face-to-face time was provided during this encounter.  Return if symptoms worsen or fail to improve. ___________________________________________ Christen Butter, DNP, APRN, FNP-BC Primary Care and Sports Medicine [facility] [place]

## 2021-12-21 ENCOUNTER — Other Ambulatory Visit: Payer: Self-pay | Admitting: Medical-Surgical

## 2021-12-21 DIAGNOSIS — J329 Chronic sinusitis, unspecified: Secondary | ICD-10-CM

## 2022-01-06 ENCOUNTER — Encounter: Payer: Self-pay | Admitting: Family Medicine

## 2022-01-06 DIAGNOSIS — E038 Other specified hypothyroidism: Secondary | ICD-10-CM

## 2022-01-06 MED ORDER — IBUPROFEN 800 MG PO TABS: 800.0000 mg | ORAL_TABLET | Freq: Three times a day (TID) | ORAL | 2 refills | Status: DC | PRN

## 2022-01-24 ENCOUNTER — Ambulatory Visit: Payer: BC Managed Care – PPO | Admitting: Sports Medicine

## 2022-01-28 ENCOUNTER — Ambulatory Visit (INDEPENDENT_AMBULATORY_CARE_PROVIDER_SITE_OTHER): Payer: BC Managed Care – PPO | Admitting: Sports Medicine

## 2022-01-28 DIAGNOSIS — B070 Plantar wart: Secondary | ICD-10-CM | POA: Insufficient documentation

## 2022-01-28 NOTE — Assessment & Plan Note (Signed)
Pleasant 52 year old female, she has had almost a month of pain plantar aspect right foot first MTP. She thought she might of stepped on a piece of glass. On exam she does have a wart on the plantar first MTP. Today we pared down the wart, there was an underlying corn as well, we pared it down to sensate skin and then did aggressive cryotherapy. Patient declines pain medication. She will do a postop shoe, and return to see me in about 2 weeks.

## 2022-01-28 NOTE — Progress Notes (Signed)
    Procedures performed today:    None.  Independent interpretation of notes and tests performed by another provider:   None.  Brief History, Exam, Impression, and Recommendations:    Plantar wart and corn of right foot Pleasant 52 year old female, she has had almost a month of pain plantar aspect right foot first MTP. She thought she might of stepped on a piece of glass. On exam she does have a wart on the plantar first MTP. Today we pared down the wart, there was an underlying corn as well, we pared it down to sensate skin and then did aggressive cryotherapy. Patient declines pain medication. She will do a postop shoe, and return to see me in about 2 weeks.    ____________________________________________ Gwen Her. Dianah Field, M.D., ABFM., CAQSM., AME. Primary Care and Sports Medicine [place] [facility]  Adjunct Professor of [place] [employer] [facility] of Medicine  Risk manager

## 2022-02-11 ENCOUNTER — Ambulatory Visit: Payer: BC Managed Care – PPO | Admitting: Sports Medicine

## 2022-02-11 DIAGNOSIS — B070 Plantar wart: Secondary | ICD-10-CM | POA: Diagnosis not present

## 2022-02-11 NOTE — Progress Notes (Signed)
    Procedures performed today:    Procedure:  Cryodestruction of right foot plantar wart Consent obtained and verified. Time-out conducted. Noted no overlying erythema, induration, or other signs of local infection. Completed without difficulty using Cryo-Gun. Advised to call if fevers/chills, erythema, induration, drainage, or persistent bleeding.  Independent interpretation of notes and tests performed by another provider:   None.  Brief History, Exam, Impression, and Recommendations:    Plantar wart and corn of right foot Repeat paring and cryotherapy of a plantar wart. If this is not sufficiently resolved by a follow-up visit in a month we will have podiatry weigh in.    ____________________________________________ Gwen Her. Dianah Field, M.D., ABFM., CAQSM., AME. Primary Care and Sports Medicine [place] [facility]  Adjunct Professor of [place] [employer] [facility] of Medicine  Risk manager

## 2022-02-11 NOTE — Assessment & Plan Note (Signed)
Repeat paring and cryotherapy of a plantar wart. If this is not sufficiently resolved by a follow-up visit in a month we will have podiatry weigh in.

## 2022-02-21 ENCOUNTER — Ambulatory Visit (INDEPENDENT_AMBULATORY_CARE_PROVIDER_SITE_OTHER): Payer: BC Managed Care – PPO

## 2022-02-21 ENCOUNTER — Ambulatory Visit: Payer: BC Managed Care – PPO | Admitting: Sports Medicine

## 2022-02-21 DIAGNOSIS — G8929 Other chronic pain: Secondary | ICD-10-CM

## 2022-02-21 DIAGNOSIS — M7062 Trochanteric bursitis, left hip: Secondary | ICD-10-CM | POA: Diagnosis not present

## 2022-02-21 DIAGNOSIS — M25552 Pain in left hip: Secondary | ICD-10-CM

## 2022-02-21 MED ORDER — HYDROCODONE-ACETAMINOPHEN 10-325 MG PO TABS: 1.0000 | ORAL_TABLET | Freq: Three times a day (TID) | ORAL | 0 refills | Status: DC | PRN

## 2022-02-21 MED ORDER — MELOXICAM 15 MG PO TABS: ORAL_TABLET | ORAL | 3 refills | Status: DC

## 2022-02-21 NOTE — Progress Notes (Signed)
    Procedures performed today:    None.  Independent interpretation of notes and tests performed by another provider:   None.  Brief History, Exam, Impression, and Recommendations:    Trochanteric bursitis of left hip Very pleasant 52 year old female, recently having increasing pain left lateral hip worse at night, she is profound tenderness over the greater trochanteric bursa, we will start conservatively, x-rays, meloxicam, some hydrocodone at night, home physical therapy. Return to see me in several weeks, she understands that sometimes it takes 4 to 6 weeks to get this better with conservative treatment but she is going to come back in 2 weeks for injection if not at least heading in the right direction.    ____________________________________________ Gwen Her. Dianah Field, M.D., ABFM., CAQSM., AME. Primary Care and Sports Medicine  [facility]  Adjunct Professor of [place] [employer] [facility] of Medicine  Risk manager

## 2022-02-21 NOTE — Assessment & Plan Note (Signed)
Very pleasant 52 year old female, recently having increasing pain left lateral hip worse at night, she is profound tenderness over the greater trochanteric bursa, we will start conservatively, x-rays, meloxicam, some hydrocodone at night, home physical therapy. Return to see me in several weeks, she understands that sometimes it takes 4 to 6 weeks to get this better with conservative treatment but she is going to come back in 2 weeks for injection if not at least heading in the right direction.

## 2022-03-03 ENCOUNTER — Ambulatory Visit: Payer: BC Managed Care – PPO | Admitting: Sports Medicine

## 2022-03-11 ENCOUNTER — Ambulatory Visit: Payer: BC Managed Care – PPO | Admitting: Sports Medicine

## 2022-03-15 ENCOUNTER — Ambulatory Visit: Payer: BC Managed Care – PPO | Admitting: Sports Medicine

## 2022-05-16 ENCOUNTER — Encounter: Payer: Self-pay | Admitting: Sports Medicine

## 2022-05-16 ENCOUNTER — Ambulatory Visit: Payer: BC Managed Care – PPO | Admitting: Sports Medicine

## 2022-05-16 VITALS — BP 106/66 | HR 77

## 2022-05-16 DIAGNOSIS — L989 Disorder of the skin and subcutaneous tissue, unspecified: Secondary | ICD-10-CM

## 2022-05-16 DIAGNOSIS — R0781 Pleurodynia: Secondary | ICD-10-CM | POA: Insufficient documentation

## 2022-05-16 NOTE — Progress Notes (Signed)
    Procedures performed today:    Procedure:  Cryodestruction of chest suspicious lesion, approximately 0.5 cm Consent obtained and verified. Time-out conducted. Noted no overlying erythema, induration, or other signs of local infection. Completed without difficulty using Cryo-Gun. Advised to call if fevers/chills, erythema, induration, drainage, or persistent bleeding.  Independent interpretation of notes and tests performed by another provider:   None.  Brief History, Exam, Impression, and Recommendations:    Skin lesion of chest wall Suspect small squamous carcinoma, cryotherapy as above, return in a month.    ____________________________________________ Ihor Austin. Benjamin Stain, M.D., ABFM., CAQSM., AME. Primary Care and Sports Medicine [place] [facility]  Adjunct Professor of Family Medicine  [place] [employer] [facility] [employer] Medicine  Restaurant manager, fast food

## 2022-05-16 NOTE — Assessment & Plan Note (Signed)
Suspect small squamous carcinoma, cryotherapy as above, return in a month.

## 2022-06-13 ENCOUNTER — Ambulatory Visit: Payer: BC Managed Care – PPO | Admitting: Sports Medicine

## 2022-06-16 ENCOUNTER — Ambulatory Visit: Payer: BC Managed Care – PPO | Admitting: Sports Medicine

## 2022-06-16 ENCOUNTER — Encounter: Payer: Self-pay | Admitting: Sports Medicine

## 2022-06-16 VITALS — BP 106/69 | HR 87

## 2022-06-16 DIAGNOSIS — L719 Rosacea, unspecified: Secondary | ICD-10-CM

## 2022-06-16 DIAGNOSIS — L989 Disorder of the skin and subcutaneous tissue, unspecified: Secondary | ICD-10-CM | POA: Diagnosis not present

## 2022-06-16 MED ORDER — METRONIDAZOLE 0.75 % EX GEL: 1.0000 | Freq: Two times a day (BID) | CUTANEOUS | 3 refills | Status: DC

## 2022-06-16 NOTE — Assessment & Plan Note (Signed)
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 Adding topical metronidazole, we can switch to [place] if insufficient improvement after a month, if this still does not work then we will consider referral for laser treatment, she can follow this up with her PCP.

## 2022-06-16 NOTE — Progress Notes (Signed)
    Procedures performed today:    None.  Independent interpretation of notes and tests performed by another provider:   None.  Brief History, Exam, Impression, and Recommendations:    Skin lesion of chest wall This very pleasant 52 year old female returns, she had a suspicious skin lesion anterior chest, we performed cryotherapy at the last visit and it has completely disappeared. She will keep an eye out for recurrence.  Rosacea Sheryl Taylor has developed what appears to be papulopustular rosacea right cheek worse than left, no other triggers identified. Adding topical metronidazole, we can switch to [place] if insufficient improvement after a month, if this still does not work then we will consider referral for laser treatment, she can follow this up with her PCP.    ____________________________________________ Ihor Austin. Benjamin Stain, M.D., ABFM., CAQSM., AME. Primary Care and Sports Medicine [place] [facility]  Adjunct Professor of Family Medicine  [place] [employer] [facility] [employer]  Restaurant manager, fast food

## 2022-06-16 NOTE — Assessment & Plan Note (Signed)
This very pleasant 52 year old female returns, she had a suspicious skin lesion anterior chest, we performed cryotherapy at the last visit and it has completely disappeared. She will keep an eye out for recurrence.

## 2022-06-20 LAB — HM MAMMOGRAPHY

## 2022-06-28 ENCOUNTER — Encounter: Payer: Self-pay | Admitting: Family Medicine

## 2022-08-29 ENCOUNTER — Encounter: Payer: Self-pay | Admitting: Family Medicine

## 2022-08-29 ENCOUNTER — Ambulatory Visit (INDEPENDENT_AMBULATORY_CARE_PROVIDER_SITE_OTHER): Payer: BC Managed Care – PPO | Admitting: Family Medicine

## 2022-08-29 VITALS — BP 107/54 | HR 82 | Ht 62.0 in | Wt 166.0 lb

## 2022-08-29 DIAGNOSIS — M7062 Trochanteric bursitis, left hip: Secondary | ICD-10-CM | POA: Diagnosis not present

## 2022-08-29 DIAGNOSIS — K529 Noninfective gastroenteritis and colitis, unspecified: Secondary | ICD-10-CM | POA: Diagnosis not present

## 2022-08-29 DIAGNOSIS — E038 Other specified hypothyroidism: Secondary | ICD-10-CM | POA: Diagnosis not present

## 2022-08-29 DIAGNOSIS — Z0000 Encounter for general adult medical examination without abnormal findings: Secondary | ICD-10-CM | POA: Diagnosis not present

## 2022-08-29 DIAGNOSIS — M7702 Medial epicondylitis, left elbow: Secondary | ICD-10-CM

## 2022-08-29 DIAGNOSIS — R109 Unspecified abdominal pain: Secondary | ICD-10-CM

## 2022-08-29 MED ORDER — IBUPROFEN 800 MG PO TABS: 800.0000 mg | ORAL_TABLET | Freq: Three times a day (TID) | ORAL | 2 refills | Status: DC | PRN

## 2022-08-29 NOTE — Assessment & Plan Note (Signed)
Cup further for milk intolerance and gluten intolerance.  If negative consider irritable bowel syndrome.  Will call with results once available.

## 2022-08-29 NOTE — Patient Instructions (Signed)
Ice the hip when you get home and gain at bedtime and as needed.

## 2022-08-29 NOTE — Progress Notes (Signed)
Complete physical exam  Patient: Sheryl Taylor   DOB: [birth year]   52 y.o. Female  MRN: [id number]  Subjective:    Chief Complaint  Patient presents with   Annual Exam    Sheryl Taylor is a 53 y.o. female who presents today for a complete physical exam. She reports consuming a general diet. The patient does not participate in regular exercise at present. She generally feels well. She reports sleeping poorly. She does not have additional problems to discuss today.   She has not been sleeping well because of pain and bursitis in her left hip.  Been going on for months at this point.  Most nights she ends up sleeping on the couch because its only place that she can get comfortable.  Also for years she sometimes will go days without a bowel movement but then eat a larger meal and it triggers abdominal pain spasms and then urgent stools.  Sometimes the stools are loose but not always.   Most recent fall risk assessment:    08/29/2022    1:47 PM  Fall Risk   Falls in the past year? 0  Number falls in past yr: 0  Injury with Fall? 0  Risk for fall due to : No Fall Risks  Follow up Falls evaluation completed     Most recent depression screenings:    08/29/2022    1:47 PM 08/24/2021    2:17 PM  PHQ 2/9 Scores  PHQ - 2 Score 0 0        Patient Care Team: Agapito Games, MD as PCP - General (Family Medicine)   Outpatient Medications Prior to Visit  Medication Sig   cetirizine (ZYRTEC) 10 MG tablet Take 10 mg by mouth daily.   fluticasone (FLONASE) 50 MCG/ACT nasal spray Place into the nose.   valACYclovir (VALTREX) 500 MG tablet Take 500 mg by mouth 2 (two) times daily.   [DISCONTINUED] albuterol (VENTOLIN HFA) 108 (90 Base) MCG/ACT inhaler Inhale 1-2 puffs into the lungs every 6 (six) hours as needed for wheezing or shortness of breath.   [DISCONTINUED] fluconazole (DIFLUCAN) 150 MG tablet Take 1 tablet (150 mg total) by mouth daily. Repeat after 3 days if needed.    [DISCONTINUED] HYDROcodone-acetaminophen (NORCO) 10-325 MG tablet Take 1 tablet by mouth every 8 (eight) hours as needed.   [DISCONTINUED] ipratropium (ATROVENT) 0.03 % nasal spray Place 2 sprays into both nostrils every 12 (twelve) hours.   [DISCONTINUED] levothyroxine (SYNTHROID) 50 MCG tablet Take 50 mcg by mouth daily.   [DISCONTINUED] meloxicam (MOBIC) 15 MG tablet One tab PO every 24 hours with a meal for 2 weeks, then once every 24 hours prn pain.   [DISCONTINUED] metroNIDAZOLE (METROGEL) 0.75 % gel Apply 1 Application topically 2 (two) times daily.   [DISCONTINUED] predniSONE (DELTASONE) 50 MG tablet Take 1 tablet (50 mg total) by mouth daily.   [DISCONTINUED] PREVIDENT 5000 BOOSTER PLUS 1.1 % PSTE USE ONCE DAILY IN PLACE OF REGULAR TOOTHPASTE   No facility-administered medications prior to visit.    ROS        Objective:     BP (!) 107/54   Pulse 82   Ht 5\' 2"  (1.575 m)   Wt 166 lb (75.3 kg)   SpO2 98%   BMI 30.36 kg/m    Physical Exam Vitals and nursing note reviewed.  Constitutional:      Appearance: Normal appearance. She is well-developed.  HENT:     Head: Normocephalic and atraumatic.  Right Ear: Tympanic membrane, ear canal and external ear normal.     Left Ear: Tympanic membrane, ear canal and external ear normal.     Nose: Nose normal.     Mouth/Throat:     Pharynx: Oropharynx is clear.  Eyes:     Conjunctiva/sclera: Conjunctivae normal.     Pupils: Pupils are equal, round, and reactive to light.  Neck:     Thyroid: No thyromegaly.  Cardiovascular:     Rate and Rhythm: Normal rate and regular rhythm.     Heart sounds: Normal heart sounds.  Pulmonary:     Effort: Pulmonary effort is normal.     Breath sounds: Normal breath sounds. No wheezing.  Musculoskeletal:     Cervical back: Neck supple.  Lymphadenopathy:     Cervical: No cervical adenopathy.  Skin:    General: Skin is warm and dry.     Coloration: Skin is not pale.  Neurological:      Mental Status: She is alert and oriented to person, place, and time.  Psychiatric:        Behavior: Behavior normal.      No results found for any visits on 08/29/22.     Assessment & Plan:    Routine Health Maintenance and Physical Exam  Immunization History  Administered Date(s) Administered   PFIZER(Purple Top)SARS-COV-2 Vaccination 10/13/2019, 11/03/2019   Tdap 10/02/2006, 08/12/2019    Health Maintenance  Topic Date Due   Colonoscopy  01/10/2023 (Originally 07/16/2017)   COVID-19 Vaccine (3 - 2023-24 season) 01/10/2023 (Originally 09/10/2021)   Zoster Vaccines- Shingrix (1 of 2) 01/10/2023 (Originally 07/17/2022)   INFLUENZA VACCINE  01/10/2024 (Originally 08/11/2022)   MAMMOGRAM  06/20/2023   PAP SMEAR-Modifier  07/22/2025   DTaP/Tdap/Td (3 - Td or Tdap) 08/11/2029   Hepatitis C Screening  Completed   HIV Screening  Completed   Pneumococcal Vaccine 35-49 Years old  Aged Out   HPV VACCINES  Aged Out    Discussed health benefits of physical activity, and encouraged her to engage in regular exercise appropriate for her age and condition.  Problem List Items Addressed This Visit       Endocrine   Subclinical hypothyroidism    Due to recheck TSH       Relevant Orders   TSH     Musculoskeletal and Integument   Trochanteric bursitis of left hip    Recently saw Dr. Karie Schwalbe for this.  He recommended trial of anti-inflammatory, pain reliever at night and home stretches.  She still continues to have pain so recommend injection at this point.        Other   Abdominal cramping    Cup further for milk intolerance and gluten intolerance.  If negative consider irritable bowel syndrome.  Will call with results once available.      Relevant Orders   Milk Component Panel   Glia (IgA/G) + tTG IgA   IBD Expanded Panel   Other Visit Diagnoses     Wellness examination    -  Primary   Relevant Orders   CMP14+EGFR   Lipid panel   TSH   CBC   Frequent stools       Relevant  Orders   CBC   Milk Component Panel   Glia (IgA/G) + tTG IgA   IBD Expanded Panel   Medial epicondylitis of left elbow          Return in about 6 weeks (around 10/10/2022) for recheck hip and elbow .  Keep up a regular exercise program and make sure you are eating a healthy diet Try to eat 4 servings of dairy a day, or if you are lactose intolerant take a calcium with vitamin D daily.  Your vaccines are up to date.   Medial epicondylitis of the left elbow-exercises/home rehab given to do on her own and if not improving then please let me know.  Avoid direct pressure on the elbow as well making sure it is not sitting on an armrest etc.   Aspiration/Injection Procedure Note Sheryl Taylor [id number] [birth year]  Procedure: Injection Indications: pain in the right trochanteric bursitis.   Procedure Details Consent: Risks of procedure as well as the alternatives and risks of each were explained to the (patient/caregiver).  Consent for procedure obtained. Time Out: Verified patient identification, verified procedure, site/side was marked, verified correct patient position, special equipment/implants available, medications/allergies/relevent history reviewed, required imaging and test results available.  Performed   Local Anesthesia Used:Ethyl Chloride Spray Amount of Fluid Aspirated: minimal amount Injection - 1 cc of 40mg  kenalog with 9 cc of lidocaine.   A sterile dressing was applied.  Patient did tolerate procedure well. Estimated blood loss: none  Sheryl Taylor 08/29/2022, 2:37 PM     Sheryl Gasser, MD

## 2022-08-29 NOTE — Assessment & Plan Note (Signed)
Recently saw Dr. Karie Schwalbe for this.  He recommended trial of anti-inflammatory, pain reliever at night and home stretches.  She still continues to have pain so recommend injection at this point.

## 2022-08-29 NOTE — Assessment & Plan Note (Signed)
Due to recheck TSH. 

## 2022-08-30 MED ORDER — CYCLOBENZAPRINE HCL 10 MG PO TABS
10.0000 mg | ORAL_TABLET | Freq: Every evening | ORAL | 0 refills | Status: DC | PRN
Start: 2022-08-30 — End: 2023-04-25

## 2022-08-30 NOTE — Telephone Encounter (Signed)
Meds ordered this encounter  Medications  . cyclobenzaprine (FLEXERIL) 10 MG tablet    Sig: Take 1 tablet (10 mg total) by mouth at bedtime as needed for muscle spasms.    Dispense:  30 tablet    Refill:  0    

## 2022-09-22 ENCOUNTER — Ambulatory Visit: Payer: BC Managed Care – PPO

## 2022-09-22 ENCOUNTER — Encounter: Payer: Self-pay | Admitting: Sports Medicine

## 2022-09-22 ENCOUNTER — Ambulatory Visit: Payer: BC Managed Care – PPO | Admitting: Sports Medicine

## 2022-09-22 DIAGNOSIS — M5136 Other intervertebral disc degeneration, lumbar region: Secondary | ICD-10-CM | POA: Diagnosis not present

## 2022-09-22 DIAGNOSIS — R0781 Pleurodynia: Secondary | ICD-10-CM | POA: Diagnosis not present

## 2022-09-22 DIAGNOSIS — M51369 Other intervertebral disc degeneration, lumbar region without mention of lumbar back pain or lower extremity pain: Secondary | ICD-10-CM

## 2022-09-22 MED ORDER — PREGABALIN 75 MG PO CAPS: 75.0000 mg | ORAL_CAPSULE | Freq: Two times a day (BID) | ORAL | 3 refills | Status: DC

## 2022-09-22 MED ORDER — NAPROXEN 500 MG PO TABS: 500.0000 mg | ORAL_TABLET | Freq: Two times a day (BID) | ORAL | 3 refills | Status: DC

## 2022-09-22 NOTE — Assessment & Plan Note (Signed)
Pleasant 52 year old female, known lumbar DDD L4-L5, we treated her back in 2021 for some back pain, she responded well to conservative treatment including prednisone. Unfortunately having recurrence of pain axial, left-sided with radiation down the back of the left leg to the knee, also down the right leg to the top of the right foot. No red flag symptoms. Since she has just come off prednisone we will hold off on this, we will switch her from ibuprofen to naproxen, adding Lyrica in an up taper, home physical therapy, return to see me in 6 weeks, epidural if not better. She will be very cognizant as to exactly where she feels the discomfort in her toes and this will help [place] pick her nerve root.

## 2022-09-22 NOTE — Progress Notes (Signed)
    Procedures performed today:    None.  Independent interpretation of notes and tests performed by another provider:   None.  Brief History, Exam, Impression, and Recommendations:    Lumbar degenerative disc disease Pleasant 52 year old female, known lumbar DDD L4-L5, we treated her back in 2021 for some back pain, she responded well to conservative treatment including prednisone. Unfortunately having recurrence of pain axial, left-sided with radiation down the back of the left leg to the knee, also down the right leg to the top of the right foot. No red flag symptoms. Since she has just come off prednisone we will hold off on this, we will switch her from ibuprofen to naproxen, adding Lyrica in an up taper, home physical therapy, return to see me in 6 weeks, epidural if not better. She will be very cognizant as to exactly where she feels the discomfort in her toes and this will help [place] pick her nerve root.  Pleuritic chest pain Sheryl Taylor has noted a few days of increasing left-sided anterior chest wall pain, she localizes the pain at the costal cartilage, left second and third costals. She has also noted some breathlessness. Due to the above we will proceed with chest x-ray and a D-dimer.    ____________________________________________ Ihor Austin. Benjamin Stain, M.D., ABFM., CAQSM., AME. Primary Care and Sports Medicine [place] [facility]  Adjunct Professor of Family Medicine  [place] [employer] [facility] [facility] [employer] [facility] [employer]  Restaurant manager, fast food

## 2022-09-22 NOTE — Assessment & Plan Note (Addendum)
Sheryl Taylor has noted a few days of increasing left-sided anterior chest wall pain, she localizes the pain at the costal cartilage, left second and third costals. She has also noted some breathlessness. Due to the above we will proceed with chest x-ray and a D-dimer.

## 2022-09-24 LAB — D-DIMER, QUANTITATIVE: D-DIMER: 0.22 mg{FEU}/L (ref 0.00–0.49)

## 2022-09-26 ENCOUNTER — Encounter: Payer: Self-pay | Admitting: Sports Medicine

## 2022-09-26 ENCOUNTER — Encounter: Payer: Self-pay | Admitting: Family Medicine

## 2022-09-26 NOTE — Progress Notes (Signed)
Hi Sheryl Taylor, your kidney function is up from baseline normally you are between 0.7-0.9 it was 1.0 this time so just a slight change but I do want a keep an eye on that and plan to recheck it again in about 6 to 8 weeks.  Liver function look great.  LDL cholesterol is mildly elevated at 136 goal is less than 100 just continue to work on healthy food choices and staying active.  Blood count is normal no sign of anemia or infection.  Negative for milk allergy.  That does not rule out lactose intolerance but it just means that you are not completely allergic to milk products.  Thyroid level is okay

## 2022-09-27 LAB — IBD EXPANDED PANEL
ACCA: 14 U (ref 0–90)
ALCA: 16 U (ref 0–60)
AMCA: 23 U (ref 0–100)
Atypical pANCA: NEGATIVE
gASCA: 9 U (ref 0–50)

## 2022-09-27 LAB — LIPID PANEL
Chol/HDL Ratio: 3.8 ratio (ref 0.0–4.4)
Cholesterol, Total: 202 mg/dL — ABNORMAL HIGH (ref 100–199)
HDL: 53 mg/dL (ref >39)
LDL Chol Calc (NIH): 136 mg/dL — ABNORMAL HIGH (ref 0–99)
Triglycerides: 71 mg/dL (ref 0–149)
VLDL Cholesterol Cal: 13 mg/dL (ref 5–40)

## 2022-09-27 LAB — MILK COMPONENT PANEL
F076-IgE Alpha Lactalbumin: <0.1 kU/L
F077-IgE Beta Lactoglobulin: <0.1 kU/L
F078-IgE Casein: <0.1 kU/L

## 2022-09-27 LAB — CMP14+EGFR
ALT: 16 IU/L (ref 0–32)
AST: 13 IU/L (ref 0–40)
Albumin: 4.2 g/dL (ref 3.9–4.9)
Alkaline Phosphatase: 99 IU/L (ref 44–121)
BUN/Creatinine Ratio: 12 (ref 9–23)
BUN: 13 mg/dL (ref 6–24)
Bilirubin Total: 0.4 mg/dL (ref 0.0–1.2)
CO2: 24 mmol/L (ref 20–29)
Calcium: 9.3 mg/dL (ref 8.7–10.2)
Chloride: 106 mmol/L (ref 96–106)
Creatinine, Ser: 1.05 mg/dL — ABNORMAL HIGH (ref 0.57–1.00)
Globulin, Total: 2.5 g/dL (ref 1.5–4.5)
Glucose: 100 mg/dL — ABNORMAL HIGH (ref 70–99)
Potassium: 4.5 mmol/L (ref 3.5–5.2)
Sodium: 144 mmol/L (ref 134–144)
Total Protein: 6.7 g/dL (ref 6.0–8.5)
eGFR: 65 mL/min/{1.73_m2} (ref >59)

## 2022-09-27 LAB — GLIA (IGA/G) + TTG IGA
Antigliadin Abs, IgA: 2 U (ref 0–19)
Gliadin IgG: 1 U (ref 0–19)
Transglutaminase IgA: <2 U/mL (ref 0–3)

## 2022-09-27 LAB — CBC
Hematocrit: 43.5 % (ref 34.0–46.6)
Hemoglobin: 13.9 g/dL (ref 11.1–15.9)
MCH: 29.6 pg (ref 26.6–33.0)
MCHC: 32 g/dL (ref 31.5–35.7)
MCV: 93 fL (ref 79–97)
Platelets: 255 10*3/uL (ref 150–450)
RBC: 4.7 x10E6/uL (ref 3.77–5.28)
RDW: 13 % (ref 11.7–15.4)
WBC: 6.3 10*3/uL (ref 3.4–10.8)

## 2022-09-27 LAB — TSH: TSH: 4.02 u[IU]/mL (ref 0.450–4.500)

## 2022-09-27 NOTE — Progress Notes (Signed)
Also did an extra test to look for celiac which consoles cause some chronic GI issues.  And that was negative which is great.

## 2022-10-10 ENCOUNTER — Encounter: Payer: Self-pay | Admitting: Family Medicine

## 2022-10-10 ENCOUNTER — Ambulatory Visit: Payer: BC Managed Care – PPO | Admitting: Family Medicine

## 2022-10-10 VITALS — BP 111/57 | HR 85 | Ht 62.0 in

## 2022-10-10 DIAGNOSIS — R109 Unspecified abdominal pain: Secondary | ICD-10-CM

## 2022-10-10 DIAGNOSIS — M51369 Other intervertebral disc degeneration, lumbar region without mention of lumbar back pain or lower extremity pain: Secondary | ICD-10-CM

## 2022-10-10 DIAGNOSIS — R7989 Other specified abnormal findings of blood chemistry: Secondary | ICD-10-CM | POA: Diagnosis not present

## 2022-10-10 DIAGNOSIS — M7062 Trochanteric bursitis, left hip: Secondary | ICD-10-CM

## 2022-10-10 DIAGNOSIS — M5136 Other intervertebral disc degeneration, lumbar region: Secondary | ICD-10-CM

## 2022-10-10 NOTE — Assessment & Plan Note (Addendum)
Unfortunately the injection only helped for couple days still painful to sleep on that hip.  She does feel the sciatica going into that left buttock has been better and that it is more intermittent now which is good she says she never actually tried the pregabalin or the naproxen.  Did discuss maybe trying the pregabalin at least at night I think it might help her rest well.  She did feel that the Flexeril was helpful as well so she can use that if she would prefer.  She would prefer not to do formal PT at this time.  We did discuss referral to maybe an orthopedist if not improving.

## 2022-10-10 NOTE — Assessment & Plan Note (Addendum)
Unclear etiology.  Lab tests are all negative we did discuss referral potentially to GI for further workup she might benefit from endoscopy and colonoscopy but she declines today and says she will think about it and let me know.  She says it has been going on for years.  She does not have a gallbladder anymore.  Consider pancreatic insufficiency possibly we did discuss taking something like a stool softener or MiraLAX to kind of keep her bowels moving a little bit more normally and see if this helps with the abdominal pain.

## 2022-10-10 NOTE — Addendum Note (Signed)
Addended by: Nani Gasser D on: 10/10/2022 03:59 PM   Modules accepted: Level of Service

## 2022-10-10 NOTE — Progress Notes (Addendum)
Established Patient Office Visit  Subjective   Patient ID: Sheryl Taylor, female    DOB: 08/27/1970  Age: 52 y.o. MRN: [id number]  Chief Complaint  Patient presents with   Hip Pain    HPI  F/U Right trochanteric bursitis.  We did an injection on August 19.  She says the injection did help for a couple of days she has been try to do the exercises on her own at home she is not interested in formal PT. she did try the Flexeril couple of nights and it was helpful  She did follow-up with Dr. Benjamin Stain for degenerative disc disease of her low back on September 12.  She says that her sciatic pain is a little bit better's.  She never tried the naproxen and she never tried the pregabalin.  Just does not really like medicines and did not want to take anything new that she has not taken before.  She still having some stomach pain.  She says sometimes when she eats it causes pain nausea and sometimes will even trigger diarrhea though more often she only has a bowel movement maybe once a week.  We did do some testing to look for milk allergy which was negative.  Also did an IBD panel which was negative.  Also check for celiac which was negative.  She already stays away from a lot of dairy products.    ROS    Objective:     BP (!) 111/57   Pulse 85   Ht 5\' 2"  (1.575 m)   SpO2 99%   BMI 30.36 kg/m    Physical Exam Vitals and nursing note reviewed.  Constitutional:      Appearance: Normal appearance.  HENT:     Head: Normocephalic and atraumatic.  Eyes:     Conjunctiva/sclera: Conjunctivae normal.  Cardiovascular:     Rate and Rhythm: Normal rate and regular rhythm.  Pulmonary:     Effort: Pulmonary effort is normal.     Breath sounds: Normal breath sounds.  Skin:    General: Skin is warm and dry.  Neurological:     Mental Status: She is alert.  Psychiatric:        Mood and Affect: Mood normal.      No results found for any visits on 10/10/22.    The 10-year ASCVD risk  score (Arnett DK, et al., 2019) is: 1%    Assessment & Plan:   Problem List Items Addressed This Visit       Musculoskeletal and Integument   Trochanteric bursitis of left hip    Unfortunately the injection only helped for couple days still painful to sleep on that hip.  She does feel the sciatica going into that left buttock has been better and that it is more intermittent now which is good she says she never actually tried the pregabalin or the naproxen.  Did discuss maybe trying the pregabalin at least at night I think it might help her rest well.  She did feel that the Flexeril was helpful as well so she can use that if she would prefer.  She would prefer not to do formal PT at this time.  We did discuss referral to maybe an orthopedist if not improving.      Lumbar degenerative disc disease    Has had a little bit of mild improvement.  Could still try the pregabalin at night if she wanted to they did discuss possible epidural in the future if she  continued to have persistent symptoms or pain.        Other   Abdominal cramping    Unclear etiology.  Lab tests are all negative we did discuss referral potentially to GI for further workup she might benefit from endoscopy and colonoscopy but she declines today and says she will think about it and let me know.  She says it has been going on for years.  She does not have a gallbladder anymore.  Consider pancreatic insufficiency possibly we did discuss taking something like a stool softener or MiraLAX to kind of keep her bowels moving a little bit more normally and see if this helps with the abdominal pain.      Other Visit Diagnoses     Elevated serum creatinine    -  Primary   Relevant Orders   Basic Metabolic Panel (BMET)       To recheck serum creatinine in a couple of weeks.  She was taking her ibuprofen frequently at that time.  No follow-ups on file.    Nani Gasser, MD

## 2022-10-10 NOTE — Assessment & Plan Note (Signed)
Has had a little bit of mild improvement.  Could still try the pregabalin at night if she wanted to they did discuss possible epidural in the future if she continued to have persistent symptoms or pain.

## 2022-10-14 ENCOUNTER — Other Ambulatory Visit: Payer: Self-pay | Admitting: Family Medicine

## 2022-10-14 DIAGNOSIS — Z1212 Encounter for screening for malignant neoplasm of rectum: Secondary | ICD-10-CM

## 2022-10-14 DIAGNOSIS — Z1211 Encounter for screening for malignant neoplasm of colon: Secondary | ICD-10-CM

## 2022-10-18 ENCOUNTER — Encounter: Payer: Self-pay | Admitting: Family Medicine

## 2022-10-20 ENCOUNTER — Other Ambulatory Visit: Payer: Self-pay | Admitting: Family Medicine

## 2022-10-20 DIAGNOSIS — R109 Unspecified abdominal pain: Secondary | ICD-10-CM

## 2022-10-20 DIAGNOSIS — Z1211 Encounter for screening for malignant neoplasm of colon: Secondary | ICD-10-CM

## 2022-11-03 ENCOUNTER — Ambulatory Visit: Payer: BC Managed Care – PPO | Admitting: Sports Medicine

## 2022-11-07 LAB — HM COLONOSCOPY

## 2022-12-23 ENCOUNTER — Other Ambulatory Visit: Payer: Self-pay | Admitting: Family Medicine

## 2022-12-28 ENCOUNTER — Other Ambulatory Visit: Payer: Self-pay | Admitting: Family Medicine

## 2022-12-28 NOTE — Telephone Encounter (Signed)
Copied from CRM [id number]. Topic: Clinical - Medication Refill >> Dec 28, 2022  1:53 PM Dimitri Ped wrote: Most Recent Primary Care Visit:  Provider: Nani Gasser D  Department: [facility]  Visit Type: OFFICE VISIT  Date: 10/10/2022  Medication: ibuprofen (ADVIL) 800 MG tablet  Has the patient contacted their pharmacy? Yes they said they was waiting for Doctor and this has been since saturday (Agent: If no, request that the patient contact the pharmacy for the refill. If patient does not wish to contact the pharmacy document the reason why and proceed with request.) (Agent: If yes, when and what did the pharmacy advise?)  Is this the correct pharmacy for this prescription? Yes If no, delete pharmacy and type the correct one.  This is the patient's preferred pharmacy:  [facility] [id number] - [place], [place] - [address] [place] [address] Phone: [phone] Fax: [phone]   Has the prescription been filled recently? No not since september  Is the patient out of the medication? Yes  Has the patient been seen for an appointment in the last year OR does the patient have an upcoming appointment? Yes  Can we respond through MyChart? Yes  Agent: Please be advised that Rx refills may take up to 3 business days. We ask that you follow-up with your pharmacy.

## 2023-02-24 ENCOUNTER — Ambulatory Visit: Payer: Self-pay | Admitting: Family Medicine

## 2023-02-24 ENCOUNTER — Telehealth: Payer: 59 | Admitting: Physician Assistant

## 2023-02-24 DIAGNOSIS — J019 Acute sinusitis, unspecified: Secondary | ICD-10-CM | POA: Diagnosis not present

## 2023-02-24 DIAGNOSIS — R051 Acute cough: Secondary | ICD-10-CM

## 2023-02-24 DIAGNOSIS — B9689 Other specified bacterial agents as the cause of diseases classified elsewhere: Secondary | ICD-10-CM

## 2023-02-24 MED ORDER — PROMETHAZINE-DM 6.25-15 MG/5ML PO SYRP: 5.0000 mL | ORAL_SOLUTION | Freq: Four times a day (QID) | ORAL | 0 refills | Status: DC | PRN

## 2023-02-24 MED ORDER — AMOXICILLIN-POT CLAVULANATE 875-125 MG PO TABS: 1.0000 | ORAL_TABLET | Freq: Two times a day (BID) | ORAL | 0 refills | Status: DC

## 2023-02-24 NOTE — Telephone Encounter (Signed)
  Chief Complaint: sinus pain Symptoms: facial pain, nasal congestion, cough, sore throat, head pressure, lymph node swelling and soreness Frequency: Constant Pertinent Negatives: Patient denies fever, sputum, nasal drainage, facial swelling, redness, earache, SOB Disposition: [] ED /[] Urgent Care (no appt availability in office) / [x] Appointment(In office/virtual)/ []  [facility]/ [] Home Care/ [] Refused Recommended Disposition /[] [facility]/ []  Follow-up with PCP Additional Notes: Patient called with complaints of facial pain and nasal congestion with sore throat, dry cough, head pressure, and lymph node swelling and soreness. Patient states that symptoms started Monday and have slowly been getting worse. Patient states that she had been taking OTC medication with some relief for about ten minutes before symptoms return. Patient states that she usually get bronchitis twice a year and was placed on amoxicillin last time she had similar symptoms one year ago and wants something to prevent symptoms from becoming worse. Patient denies SOB, earache, facial swelling or redness, nasal drainage, sputum, and fever however states body temp is typically lower due to low thyroid levels. Patient advised by this RN to be seen within 3 days per protocol, to which patient was agreeable. Appt scheduled. Patient advised by this RN to call back with worsening symptoms or concerns. Patient verbalized understanding.   Copied from CRM [id number]. Topic: Appointments - Appointment Scheduling >> Feb 24, 2023 11:59 AM Gery Pray wrote: Patient/patient representative is calling to schedule an appointment. Refer to attachments for appointment information. Patient is congestion, Lymph nodes are sore, face sore, ears have pressure. Patient states that her pain level is about a 6. Reason for Disposition  [1] Sinus congestion (pressure, fullness) AND [2] present > 10 days  Answer Assessment - Initial Assessment  Questions 1. LOCATION: "Where does it hurt?"      Cheek, above eyelids 2. ONSET: "When did the sinus pain start?"  (e.g., hours, days)      Monday 3. SEVERITY: "How bad is the pain?"   (Scale 1-10; mild, moderate or severe)   - MILD (1-3): doesn't interfere with normal activities    - MODERATE (4-7): interferes with normal activities (e.g., work or school) or awakens from sleep   - SEVERE (8-10): excruciating pain and patient unable to do any normal activities        6 4. RECURRENT SYMPTOM: "Have you ever had sinus problems before?" If Yes, ask: "When was the last time?" and "What happened that time?"      Last year, amoxicillin 5. NASAL CONGESTION: "Is the nose blocked?" If Yes, ask: "Can you open it or must you breathe through your mouth?"     Yes 6. NASAL DISCHARGE: "Do you have discharge from your nose?" If so ask, "What color?"     Dry 7. FEVER: "Do you have a fever?" If Yes, ask: "What is it, how was it measured, and when did it start?"      No 8. OTHER SYMPTOMS: "Do you have any other symptoms?" (e.g., sore throat, cough, earache, difficulty breathing)     Sore throat, cough,  9. PREGNANCY: "Is there any chance you are pregnant?" "When was your last menstrual period?"     Denies  Protocols used: Sinus Pain or Congestion-A-AH

## 2023-02-24 NOTE — Progress Notes (Signed)
Virtual Visit Consent   Sheryl Taylor, you are scheduled for a virtual visit with a [facility] provider today. Just as with appointments in the office, your consent must be obtained to participate. Your consent will be active for this visit and any virtual visit you may have with one of our providers in the next 365 days. If you have a MyChart account, a copy of this consent can be sent to you electronically.  As this is a virtual visit, video technology does not allow for your provider to perform a traditional examination. This may limit your provider's ability to fully assess your condition. If your provider identifies any concerns that need to be evaluated in person or the need to arrange testing (such as labs, EKG, etc.), we will make arrangements to do so. Although advances in technology are sophisticated, we cannot ensure that it will always work on either your end or our end. If the connection with a video visit is poor, the visit may have to be switched to a telephone visit. With either a video or telephone visit, we are not always able to ensure that we have a secure connection.  By engaging in this virtual visit, you consent to the provision of healthcare and authorize for your insurance to be billed (if applicable) for the services provided during this visit. Depending on your insurance coverage, you may receive a charge related to this service.  I need to obtain your verbal consent now. Are you willing to proceed with your visit today? Aneth Schlagel has provided verbal consent on 02/24/2023 for a virtual visit (video or telephone). Margaretann Loveless, PA-C  Date: 02/24/2023 4:51 PM   Virtual Visit via Video Note   IMargaretann Loveless, connected with  Sheryl Taylor  ([id number], Apr 30, 1970) on 02/24/23 at  4:45 PM EST by a video-enabled telemedicine application and verified that I am speaking with the correct person using two identifiers.  Location: Patient: Virtual Visit Location Patient:  Work, isolated Provider: Virtual Visit Location Provider: Home Office   I discussed the limitations of evaluation and management by telemedicine and the availability of in person appointments. The patient expressed understanding and agreed to proceed.    History of Present Illness: Sheryl Taylor is a 53 y.o. who identifies as a female who was assigned female at birth, and is being seen today for sinus congestion and cough.  HPI: Sinusitis This is a new problem. The current episode started in the past 7 days. The problem has been gradually worsening since onset. There has been no fever. The pain is moderate. Associated symptoms include chills, congestion, coughing, ear pain (right), headaches, a hoarse voice, sinus pressure, a sore throat and swollen glands (right post auricular lymph nodes; right side only). Pertinent negatives include no sneezing. Treatments tried: alka seltzer cold and flu, dayquil, nyquil, and benadryl. The treatment provided no relief.     Problems:  Patient Active Problem List   Diagnosis Date Noted   Abdominal cramping 08/29/2022   Rosacea 06/16/2022   Pleuritic chest pain 05/16/2022   Trochanteric bursitis of left hip 02/21/2022   Plantar wart and corn of right foot 01/28/2022   Subclinical hypothyroidism 09/03/2021   Mass of ankle, left 07/26/2019   Adnexal mass 04/25/2019   Impingement syndrome, shoulder, right 12/17/2018   Radicular pain 10/15/2018   Plantar fasciitis, right 07/29/2016   Lumbar degenerative disc disease 09/26/2014   Strain of left hip adductor muscle 09/26/2014   Tinnitus 06/05/2014   Anemia,  iron deficiency 06/05/2014   Insomnia 06/05/2014   Family history of premature coronary heart disease 07/03/2013    Allergies:  Allergies  Allergen Reactions   Codeine Phosphate Itching   Lansoprazole Other (See Comments)   Buprenorphine Hcl Rash and Swelling   Morphine And Codeine Swelling and Rash   Medications:  Current Outpatient  Medications:    amoxicillin-clavulanate (AUGMENTIN) 875-125 MG tablet, Take 1 tablet by mouth 2 (two) times daily., Disp: 20 tablet, Rfl: 0   cetirizine (ZYRTEC) 10 MG tablet, Take 10 mg by mouth daily., Disp: , Rfl:    cyclobenzaprine (FLEXERIL) 10 MG tablet, Take 1 tablet (10 mg total) by mouth at bedtime as needed for muscle spasms., Disp: 30 tablet, Rfl: 0   fluticasone (FLONASE) 50 MCG/ACT nasal spray, Place into the nose., Disp: , Rfl:    ibuprofen (ADVIL) 800 MG tablet, TAKE 1 TABLET BY MOUTH EVERY 8 HOURS AS NEEDED, Disp: 60 tablet, Rfl: 2   promethazine-dextromethorphan (PROMETHAZINE-DM) 6.25-15 MG/5ML syrup, Take 5 mLs by mouth 4 (four) times daily as needed., Disp: 118 mL, Rfl: 0   valACYclovir (VALTREX) 500 MG tablet, Take 500 mg by mouth 2 (two) times daily., Disp: , Rfl:   Observations/Objective: Patient is well-developed, well-nourished in no acute distress.  Resting comfortably  Head is normocephalic, atraumatic.  No labored breathing.  Speech is clear and coherent with logical content.  Patient is alert and oriented at baseline.    Assessment and Plan: 1. Acute bacterial sinusitis (Primary) - amoxicillin-clavulanate (AUGMENTIN) 875-125 MG tablet; Take 1 tablet by mouth 2 (two) times daily.  Dispense: 20 tablet; Refill: 0  2. Acute cough - promethazine-dextromethorphan (PROMETHAZINE-DM) 6.25-15 MG/5ML syrup; Take 5 mLs by mouth 4 (four) times daily as needed.  Dispense: 118 mL; Refill: 0  - Worsening symptoms that have not responded to OTC medications.  - Will give Augmentin - Promethazine DM for cough - Continue allergy medications.  - Tylenol and Ibuprofen can be alternated every 4 hours as needed for body aches - Steam and humidifier can help - Stay well hydrated and get plenty of rest.  - Seek in person evaluation if no symptom improvement or if symptoms worsen   Follow Up Instructions: I discussed the assessment and treatment plan with the patient. The patient  was provided an opportunity to ask questions and all were answered. The patient agreed with the plan and demonstrated an understanding of the instructions.  A copy of instructions were sent to the patient via MyChart unless otherwise noted below.    The patient was advised to call back or seek an in-person evaluation if the symptoms worsen or if the condition fails to improve as anticipated.    Margaretann Loveless, PA-C

## 2023-02-24 NOTE — Patient Instructions (Signed)
[name], thank you for joining Margaretann Loveless, PA-C for today's virtual visit.  While this provider is not your primary care provider (PCP), if your PCP is located in our provider database this encounter information will be shared with them immediately following your visit.   A [facility] MyChart account gives you access to today's visit and all your visits, tests, and labs performed at [facility] " click here if you don't have a [facility] MyChart account or go to [email or web address]  Consent: (Patient) Sheryl Taylor provided verbal consent for this virtual visit at the beginning of the encounter.  Current Medications:  Current Outpatient Medications:    amoxicillin-clavulanate (AUGMENTIN) 875-125 MG tablet, Take 1 tablet by mouth 2 (two) times daily., Disp: 20 tablet, Rfl: 0   cetirizine (ZYRTEC) 10 MG tablet, Take 10 mg by mouth daily., Disp: , Rfl:    cyclobenzaprine (FLEXERIL) 10 MG tablet, Take 1 tablet (10 mg total) by mouth at bedtime as needed for muscle spasms., Disp: 30 tablet, Rfl: 0   fluticasone (FLONASE) 50 MCG/ACT nasal spray, Place into the nose., Disp: , Rfl:    ibuprofen (ADVIL) 800 MG tablet, TAKE 1 TABLET BY MOUTH EVERY 8 HOURS AS NEEDED, Disp: 60 tablet, Rfl: 2   promethazine-dextromethorphan (PROMETHAZINE-DM) 6.25-15 MG/5ML syrup, Take 5 mLs by mouth 4 (four) times daily as needed., Disp: 118 mL, Rfl: 0   valACYclovir (VALTREX) 500 MG tablet, Take 500 mg by mouth 2 (two) times daily., Disp: , Rfl:    Medications ordered in this encounter:  Meds ordered this encounter  Medications   amoxicillin-clavulanate (AUGMENTIN) 875-125 MG tablet    Sig: Take 1 tablet by mouth 2 (two) times daily.    Dispense:  20 tablet    Refill:  0    Supervising Provider:   Merrilee Jansky [[id number]]   promethazine-dextromethorphan (PROMETHAZINE-DM) 6.25-15 MG/5ML syrup    Sig: Take 5 mLs by mouth 4 (four) times daily as needed.    Dispense:  118 mL     Refill:  0    Supervising Provider:   Merrilee Jansky [[id number]]     *If you need refills on other medications prior to your next appointment, please contact your pharmacy*  Follow-Up: Call back or seek an in-person evaluation if the symptoms worsen or if the condition fails to improve as anticipated.  [facility] [phone]  Other Instructions Sinus Infection, Adult A sinus infection, also called sinusitis, is inflammation of your sinuses. Sinuses are hollow spaces in the bones around your face. Your sinuses are located: Around your eyes. In the middle of your forehead. Behind your nose. In your cheekbones. Mucus normally drains out of your sinuses. When your nasal tissues become inflamed or swollen, mucus can become trapped or blocked. This allows bacteria, viruses, and fungi to grow, which leads to infection. Most infections of the sinuses are caused by a virus. A sinus infection can develop quickly. It can last for up to 4 weeks (acute) or for more than 12 weeks (chronic). A sinus infection often develops after a cold. What are the causes? This condition is caused by anything that creates swelling in the sinuses or stops mucus from draining. This includes: Allergies. Asthma. Infection from bacteria or viruses. Deformities or blockages in your nose or sinuses. Abnormal growths in the nose (nasal polyps). Pollutants, such as chemicals or irritants in the air. Infection from fungi. This is rare. What increases the risk? You are more likely to  develop this condition if you: Have a weak body defense system (immune system). Do a lot of swimming or diving. Overuse nasal sprays. Smoke. What are the signs or symptoms? The main symptoms of this condition are pain and a feeling of pressure around the affected sinuses. Other symptoms include: Stuffy nose or congestion that makes it difficult to breathe through your nose. Thick yellow or greenish drainage from your  nose. Tenderness, swelling, and warmth over the affected sinuses. A cough that may get worse at night. Decreased sense of smell and taste. Extra mucus that collects in the throat or the back of the nose (postnasal drip) causing a sore throat or bad breath. Tiredness (fatigue). Fever. How is this diagnosed? This condition is diagnosed based on: Your symptoms. Your medical history. A physical exam. Tests to find out if your condition is acute or chronic. This may include: Checking your nose for nasal polyps. Viewing your sinuses using a device that has a light (endoscope). Testing for allergies or bacteria. Imaging tests, such as an MRI or CT scan. In rare cases, a bone biopsy may be done to rule out more serious types of fungal sinus disease. How is this treated? Treatment for a sinus infection depends on the cause and whether your condition is chronic or acute. If caused by a virus, your symptoms should go away on their own within 10 days. You may be given medicines to relieve symptoms. They include: Medicines that shrink swollen nasal passages (decongestants). A spray that eases inflammation of the nostrils (topical intranasal corticosteroids). Rinses that help get rid of thick mucus in your nose (nasal saline washes). Medicines that treat allergies (antihistamines). Over-the-counter pain relievers. If caused by bacteria, your health care provider may recommend waiting to see if your symptoms improve. Most bacterial infections will get better without antibiotic medicine. You may be given antibiotics if you have: A severe infection. A weak immune system. If caused by narrow nasal passages or nasal polyps, surgery may be needed. Follow these instructions at home: Medicines Take, use, or apply over-the-counter and prescription medicines only as told by your health care provider. These may include nasal sprays. If you were prescribed an antibiotic medicine, take it as told by your  health care provider. Do not stop taking the antibiotic even if you start to feel better. Hydrate and humidify  Drink enough fluid to keep your urine pale yellow. Staying hydrated will help to thin your mucus. Use a cool mist humidifier to keep the humidity level in your home above 50%. Inhale steam for 10-15 minutes, 3-4 times a day, or as told by your health care provider. You can do this in the bathroom while a hot shower is running. Limit your exposure to cool or dry air. Rest Rest as much as possible. Sleep with your head raised (elevated). Make sure you get enough sleep each night. General instructions  Apply a warm, moist washcloth to your face 3-4 times a day or as told by your health care provider. This will help with discomfort. Use nasal saline washes as often as told by your health care provider. Wash your hands often with soap and water to reduce your exposure to germs. If soap and water are not available, use hand sanitizer. Do not smoke. Avoid being around people who are smoking (secondhand smoke). Keep all follow-up visits. This is important. Contact a health care provider if: You have a fever. Your symptoms get worse. Your symptoms do not improve within 10 days. Get  help right away if: You have a severe headache. You have persistent vomiting. You have severe pain or swelling around your face or eyes. You have vision problems. You develop confusion. Your neck is stiff. You have trouble breathing. These symptoms may be an emergency. Get help right away. Call 911. Do not wait to see if the symptoms will go away. Do not drive yourself to the hospital. Summary A sinus infection is soreness and inflammation of your sinuses. Sinuses are hollow spaces in the bones around your face. This condition is caused by nasal tissues that become inflamed or swollen. The swelling traps or blocks the flow of mucus. This allows bacteria, viruses, and fungi to grow, which leads to  infection. If you were prescribed an antibiotic medicine, take it as told by your health care provider. Do not stop taking the antibiotic even if you start to feel better. Keep all follow-up visits. This is important. This information is not intended to replace advice given to you by your health care provider. Make sure you discuss any questions you have with your health care provider. Document Revised: 12/01/2020 Document Reviewed: 12/01/2020 Elsevier Patient Education  2024 [employer]vier [employer]   If you have been instructed to have an in-person evaluation today at a local Urgent Care facility, please use the link below. It will take you to a list of all of our available [facility], including address, phone number and hours of operation. Please do not delay care.  [facility]  If you or a family member do not have a primary care provider, use the link below to schedule a visit and establish care. When you choose a [facility] primary care physician or advanced practice provider, you gain a long-term partner in health. Find a Primary Care Provider  Learn more about [facility]'s in-office and virtual care options:  - Get Care Now

## 2023-02-24 NOTE — Telephone Encounter (Signed)
Copied from CRM [id number]. Topic: Clinical - Red Word Triage >> Feb 24, 2023  8:06 AM Nila Nephew wrote: Red Word that prompted transfer to Nurse Triage: Patient states swollen painful lymph nodes for a week - medication not touching it - thinks sinus infection - husband having same symptoms   Chief Complaint: sinus pain and congestion x 4 days Symptoms: facial pain, ear pain and sore throat Frequency: constant Pertinent Negatives: Patient denies fever Disposition: [] ED /[x] Urgent Care (no appt availability in office) / [] Appointment(In office/virtual)/ []  [facility]/ [] Home Care/ [] Refused Recommended Disposition /[]  Mobile Bus/ []  Follow-up with PCP Additional Notes: Pt reports that her and her husband have been sick all with with the same symptoms. Pt reports concern for sinus infection. No appts avail today, pt not wanting to schedule for next avail on Monday and does not want to explore other offices. Pt sts that she"might" go to an Urgent Care and ended call abruptly.   Reason for Disposition  [1] Using nasal washes and pain medicine > 24 hours AND [2] sinus pain (around cheekbone or eye) persists  Answer Assessment - Initial Assessment Questions 1. LOCATION: "Where does it hurt?"      Facial pain  2. ONSET: "When did the sinus pain start?"  (e.g., hours, days)      Started 4 days ago  3. SEVERITY: "How bad is the pain?"   (Scale 1-10; mild, moderate or severe)   - MILD (1-3): doesn't interfere with normal activities    - MODERATE (4-7): interferes with normal activities (e.g., work or school) or awakens from sleep   - SEVERE (8-10): excruciating pain and patient unable to do any normal activities        Moderate  4. RECURRENT SYMPTOM: "Have you ever had sinus problems before?" If Yes, ask: "When was the last time?" and "What happened that time?"      Yes, has had Sinus Symptoms before and it usually turns into  bronchitis  5. NASAL CONGESTION: "Is the nose  blocked?" If Yes, ask: "Can you open it or must you breathe through your mouth?"     Yes  6. NASAL DISCHARGE: "Do you have discharge from your nose?" If so ask, "What color?"     Yes  7. FEVER: "Do you have a fever?" If Yes, ask: "What is it, how was it measured, and when did it start?"      Has not checked  8. OTHER SYMPTOMS: "Do you have any other symptoms?" (e.g., sore throat, cough, earache, difficulty breathing)     Sore throat, right ear pain and swollen lymph nodes, and cough  9. PREGNANCY: "Is there any chance you are pregnant?" "When was your last menstrual period?"     LMP- 6 years ago  Protocols used: Sinus Pain or Congestion-A-AH

## 2023-03-03 IMAGING — MR MR SHOULDER*R* W/O CM
5 series · 40 of 40 positions shown · non-contrast
Comparison: Right shoulder x-rays dated April 30, 2020.

CLINICAL DATA: Chronic right shoulder pain limited range of motion.
No injury or prior surgery.

EXAM:
MRI OF THE RIGHT SHOULDER WITHOUT CONTRAST
TECHNIQUE: Multiplanar, multisequence MR imaging of the shoulder was performed.
No intravenous contrast was administered.

[Series 3: T2 fat-sat · axial · 4.0mm · 0.59mm/px · z∈[+2,+87]mm · 8 of 21 slices shown (1 of 3)]
[im 1/21]
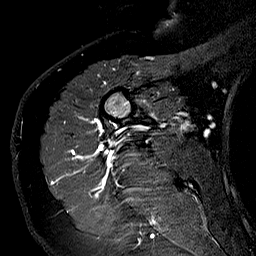
[im 3/21]
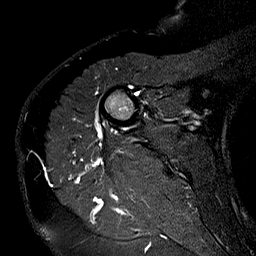
[im 6/21]
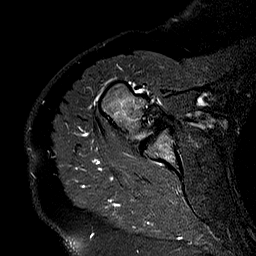
[im 9/21]
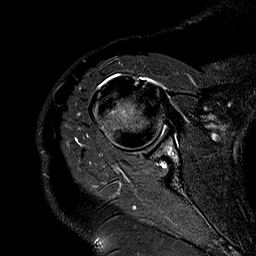
[im 12/21]
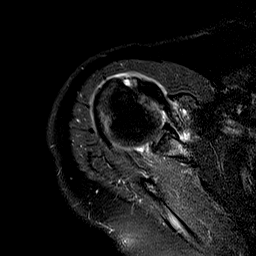
[im 15/21]
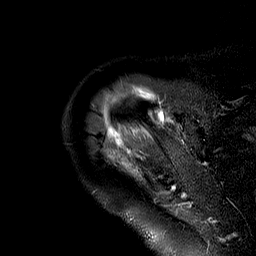
[im 18/21]
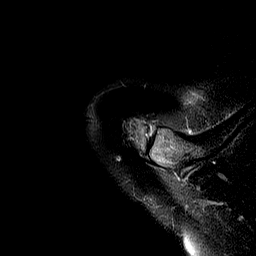
[im 21/21]
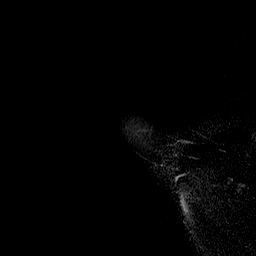

[Series 4: T2 fat-sat · oblique · 4.0mm · 0.59mm/px · 8 of 19 slices shown (2 of 3)]
[im 1/19]
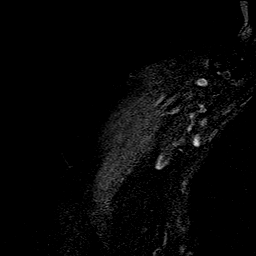
[im 3/19]
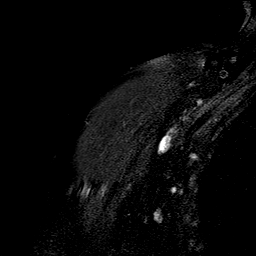
[im 6/19]
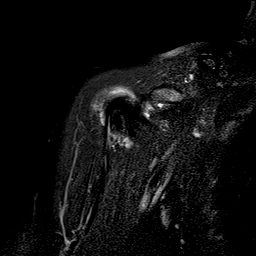
[im 8/19]
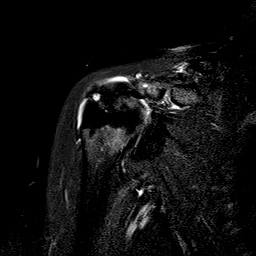
[im 11/19]
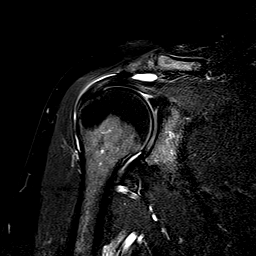
[im 13/19]
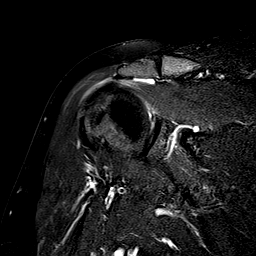
[im 16/19]
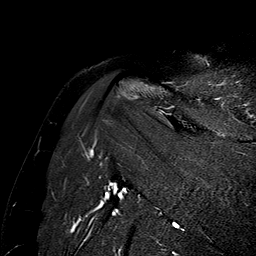
[im 19/19]
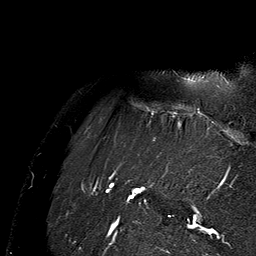

[Series 5: PD · oblique · 4.0mm · 0.59mm/px · 8 of 19 slices shown]
[im 1/19]
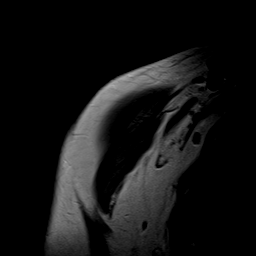
[im 3/19]
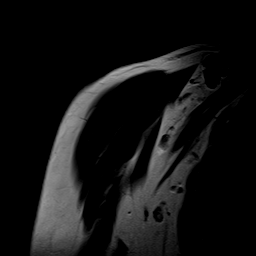
[im 6/19]
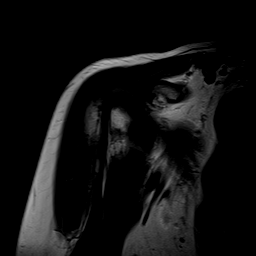
[im 8/19]
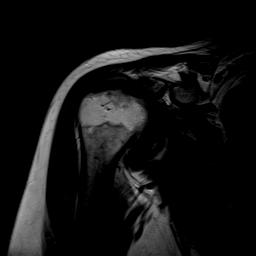
[im 11/19]
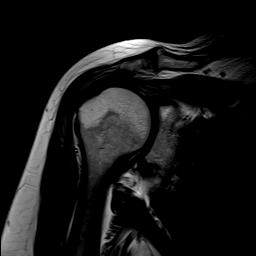
[im 13/19]
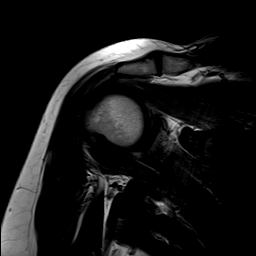
[im 16/19]
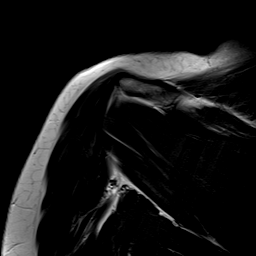
[im 19/19]
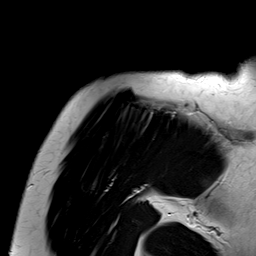

[Series 6: T1 · oblique · 4.0mm · 0.59mm/px · 8 of 20 slices shown]
[im 1/20]
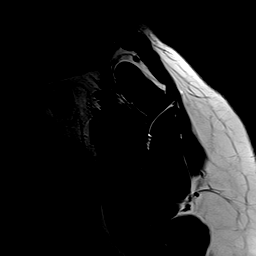
[im 3/20]
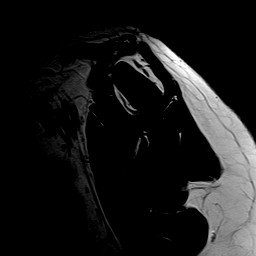
[im 6/20]
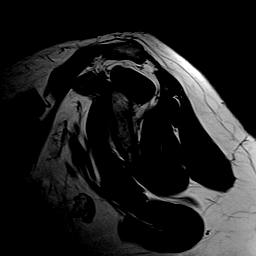
[im 9/20]
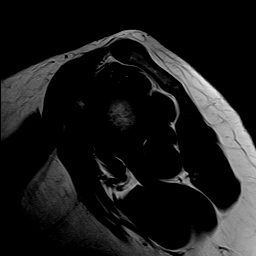
[im 11/20]
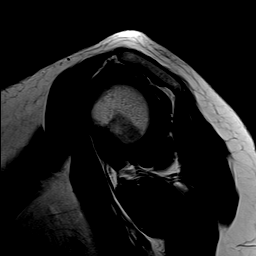
[im 14/20]
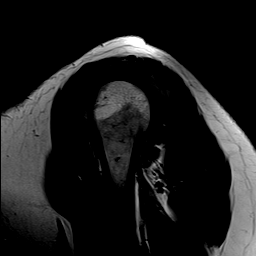
[im 17/20]
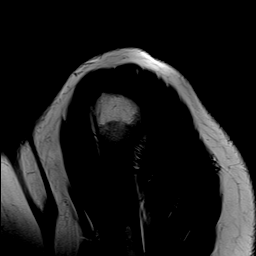
[im 20/20]
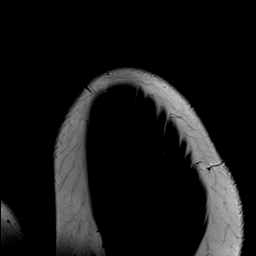

[Series 7: T2 fat-sat · oblique · 4.0mm · 0.59mm/px · 8 of 20 slices shown (3 of 3)]
[im 1/20]
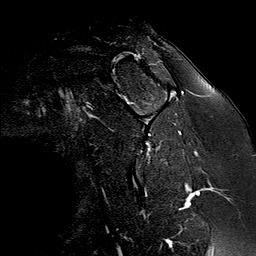
[im 3/20]
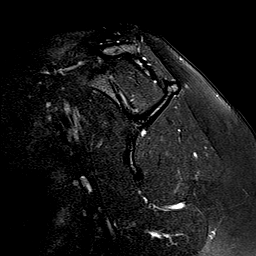
[im 6/20]
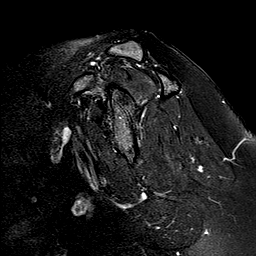
[im 9/20]
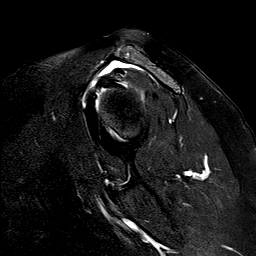
[im 11/20]
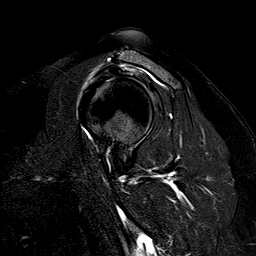
[im 14/20]
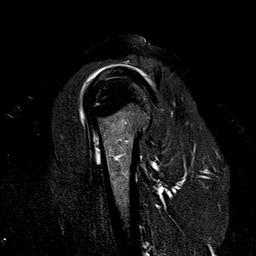
[im 17/20]
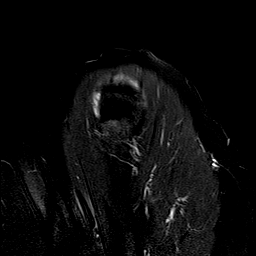
[im 20/20]
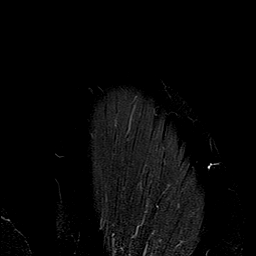

[40 of 40 positions shown; findings below may reference images not displayed]

FINDINGS: Rotator cuff: Intact rotator cuff. Moderate supraspinatus
tendinosis.

Muscles: No atrophy or abnormal signal of the muscles of the rotator
cuff.

Biceps long head:  Intact and normally positioned.

Acromioclavicular Joint: Mild arthropathy of the acromioclavicular
joint. Type II acromion. Small amount of fluid in the
subacromial/subdeltoid bursa.

Glenohumeral Joint: No joint effusion. No chondral defect.

Labrum: Grossly intact, but evaluation is limited by lack of
intraarticular fluid.

Bones:  No marrow abnormality, fracture or dislocation.

Other: None.
IMPRESSION: 1. Moderate supraspinatus tendinosis. No rotator cuff tendon tear.
2. Mild acromioclavicular osteoarthritis.
3. Mild subacromial/subdeltoid bursitis.

## 2023-04-21 LAB — LAB REPORT - SCANNED: EGFR: 65

## 2023-04-25 ENCOUNTER — Encounter: Payer: Self-pay | Admitting: Family Medicine

## 2023-04-25 ENCOUNTER — Ambulatory Visit (INDEPENDENT_AMBULATORY_CARE_PROVIDER_SITE_OTHER): Admitting: Family Medicine

## 2023-04-25 VITALS — BP 116/61 | HR 86 | Ht 62.0 in | Wt 173.0 lb

## 2023-04-25 DIAGNOSIS — R7989 Other specified abnormal findings of blood chemistry: Secondary | ICD-10-CM | POA: Diagnosis not present

## 2023-04-25 NOTE — Progress Notes (Signed)
   Established Patient Office Visit  Subjective  Patient ID: Sheryl Taylor, female    DOB: [birth year]  Age: 53 y.o. MRN: [id number]  Chief Complaint  Patient presents with   Results    Pt would like to discuss results from GYN.    HPI  Pt reports that for the past 3 months her GYN has had her to have her labs checked because her Creatinine levels have been elevated and was advised to f/u with her pcp about this.    Serum creatinine: 09/23/22-1.05 02/02/23-1.03 03/21/23 - 1.02 04/21/23: 1.04  She says that she rarely takes an NSAID she does have a prescription ibuprofen on her chart but that she has not taken it in a long time.  She feels like she hydrates well.  She has not really started any other new medications.  Is not on any blood pressure medicines.  She has been taking her thyroid medicine consistently.    ROS    Objective:     BP 116/61   Pulse 86   Ht 5\' 2"  (1.575 m)   Wt 173 lb (78.5 kg)   SpO2 96%   BMI 31.64 kg/m    Physical Exam Vitals reviewed.  Constitutional:      Appearance: Normal appearance.  HENT:     Head: Normocephalic.  Pulmonary:     Effort: Pulmonary effort is normal.  Neurological:     Mental Status: She is alert and oriented to person, place, and time.  Psychiatric:        Mood and Affect: Mood normal.        Behavior: Behavior normal.      No results found for any visits on 04/25/23.    The 10-year ASCVD risk score (Arnett DK, et al., 2019) is: 1.1%    Assessment & Plan:   Problem List Items Addressed This Visit       Other   Elevated serum creatinine - Primary   We did discuss the elevation that has been persistent since September.  If you go back several years her typical serum creatinine is right around 0.9.  So it is definitely been a persistent shift but it has been stable over the last 6 months which is really reassuring.  We did discuss at least doing some extra workup as well as scheduling her for an ultrasound if  everything checks out normal then our plan will be to just follow it over time and then plan to recheck it again in 6 months.      Relevant Orders   US  Renal   Hepatitis C antibody   HIV Antibody (routine testing w rflx)   Urine Microscopic   Microalbumin / creatinine urine ratio   Protein electrophoresis, serum   ANA   C-reactive protein   Sedimentation rate   CBC with Differential/Platelet   Protein Electrophoresis, Urine Rflx.    No follow-ups on file.    Duaine German, MD

## 2023-04-25 NOTE — Assessment & Plan Note (Signed)
 We did discuss the elevation that has been persistent since September.  If you go back several years her typical serum creatinine is right around 0.9.  So it is definitely been a persistent shift but it has been stable over the last 6 months which is really reassuring.  We did discuss at least doing some extra workup as well as scheduling her for an ultrasound if everything checks out normal then our plan will be to just follow it over time and then plan to recheck it again in 6 months.

## 2023-04-25 NOTE — Progress Notes (Signed)
 Pt reports that for the past 3 months her GYN has had her to have her labs checked because her Creatinine levels have been elevated and was advised to f/u with her pcp about this.

## 2023-04-26 ENCOUNTER — Ambulatory Visit

## 2023-04-26 DIAGNOSIS — R7989 Other specified abnormal findings of blood chemistry: Secondary | ICD-10-CM

## 2023-04-26 LAB — URINALYSIS, MICROSCOPIC ONLY
Bacteria, UA: NONE SEEN
Casts: NONE SEEN /LPF

## 2023-04-27 ENCOUNTER — Encounter: Payer: Self-pay | Admitting: Family Medicine

## 2023-04-27 LAB — CBC WITH DIFFERENTIAL/PLATELET
Basophils Absolute: 0.1 10*3/uL (ref 0.0–0.2)
Basos: 1 %
EOS (ABSOLUTE): 0.1 10*3/uL (ref 0.0–0.4)
Eos: 1 %
Hematocrit: 44 % (ref 34.0–46.6)
Hemoglobin: 14.3 g/dL (ref 11.1–15.9)
Immature Grans (Abs): 0 10*3/uL (ref 0.0–0.1)
Immature Granulocytes: 0 %
Lymphocytes Absolute: 2.9 10*3/uL (ref 0.7–3.1)
Lymphs: 35 %
MCH: 29.5 pg (ref 26.6–33.0)
MCHC: 32.5 g/dL (ref 31.5–35.7)
MCV: 91 fL (ref 79–97)
Monocytes Absolute: 0.6 10*3/uL (ref 0.1–0.9)
Monocytes: 7 %
Neutrophils Absolute: 4.5 10*3/uL (ref 1.4–7.0)
Neutrophils: 56 %
Platelets: 367 10*3/uL (ref 150–450)
RBC: 4.84 x10E6/uL (ref 3.77–5.28)
RDW: 12.8 % (ref 11.7–15.4)
WBC: 8.2 10*3/uL (ref 3.4–10.8)

## 2023-04-27 LAB — PROTEIN ELECTROPHORESIS, SERUM
A/G Ratio: 1.3 (ref 0.7–1.7)
Albumin ELP: 4 g/dL (ref 2.9–4.4)
Alpha 1: 0.1 g/dL (ref 0.0–0.4)
Alpha 2: 0.7 g/dL (ref 0.4–1.0)
Beta: 1.1 g/dL (ref 0.7–1.3)
Gamma Globulin: 1 g/dL (ref 0.4–1.8)
Globulin, Total: 3 g/dL (ref 2.2–3.9)
Total Protein: 7 g/dL (ref 6.0–8.5)

## 2023-04-27 LAB — C-REACTIVE PROTEIN: CRP: 3 mg/L (ref 0–10)

## 2023-04-27 LAB — ANA: Anti Nuclear Antibody (ANA): NEGATIVE

## 2023-04-27 LAB — SEDIMENTATION RATE: Sed Rate: 12 mm/h (ref 0–40)

## 2023-04-27 LAB — HEPATITIS C ANTIBODY: Hep C Virus Ab: NONREACTIVE

## 2023-04-27 LAB — HIV ANTIBODY (ROUTINE TESTING W REFLEX)

## 2023-04-27 LAB — MICROALBUMIN / CREATININE URINE RATIO
Creatinine, Urine: 98.2 mg/dL
Microalb/Creat Ratio: <3 mg/g{creat} (ref 0–29)
Microalbumin, Urine: <3 ug/mL

## 2023-04-27 NOTE — Telephone Encounter (Signed)
 Ultrasound has not yet resulted.

## 2023-04-27 NOTE — Progress Notes (Signed)
 Hi [facility], negative for hepatitis C and negative for HIV.  No excess protein in the urine which is great so that rules out certain kidney diseases.  Negative for lupus.  Inflammatory markers are negative so less likely to be autoimmune.  Blood count is normal no sign of anemia.  And test for multiple myeloma was normal.  At this point I want to just keep an eye on it and plan to recheck your renal function in 3 to 4 months.  Avoid any medications such as ibuprofen or Aleve that can cause stress to your kidneys.  Okay to use products like Tylenol if needed for fever or pain relief or headaches.  Make sure staying well-hydrated.

## 2023-05-01 ENCOUNTER — Telehealth: Payer: Self-pay

## 2023-05-01 NOTE — Telephone Encounter (Signed)
 US  not yet resulted.

## 2023-05-01 NOTE — Telephone Encounter (Signed)
 Copied from CRM [id number]. Topic: Clinical - Lab/Test Results >> May 01, 2023  3:18 PM Suzette B wrote: Reason for CRM: Patient has called to see if her ultrasound results has been read, please call patient at [phone] to advise

## 2023-05-02 ENCOUNTER — Encounter: Payer: Self-pay | Admitting: Family Medicine

## 2023-05-02 NOTE — Progress Notes (Signed)
 HI Kamaiya, your kidneys look good. Normal measurements and not stones.  No mass and no chronic changes.  That is great!!!! Lets plan to recheck kidney blood level in 12 weeks.

## 2023-05-15 ENCOUNTER — Ambulatory Visit: Payer: Self-pay

## 2023-05-15 NOTE — Telephone Encounter (Signed)
  Chief Complaint: leg pain and swelling Symptoms: pain and swelling to lower part of leg Frequency: started Saturday Pertinent Negatives: Patient denies CP, SOB Disposition: [] ED /[] Urgent Care (no appt availability in office) / [x] Appointment(In office/virtual)/ []  [facility]/ [] Home Care/ [] Refused Recommended Disposition /[] [facility]/ []  Follow-up with PCP Additional Notes: patient calling with concerns for pain and swelling to lower part of left leg. Patient states symptoms started on Saturday. Denies CP or SOB. Per protocol, patient is recommended to be seen within three days. Appointment Sherrie Doing for 05/17/2023 at 10:10 AM. Patient verbalized understanding of plan and all questions answered.    Copied from CRM [id number]. Topic: Clinical - Red Word Triage >> May 15, 2023  4:58 PM Kevelyn M wrote: Red Word that prompted transfer to Nurse Triage: Left leg pain and swelling Reason for Disposition  [1] MILD swelling of both ankles (i.e., pedal edema) AND [2] new-onset or worsening  Answer Assessment - Initial Assessment Questions 1. ONSET: "When did the swelling start?" (e.g., minutes, hours, days)     Started May 3rd 2. LOCATION: "What part of the leg is swollen?"  "Are both legs swollen or just one leg?"     Left leg 3. SEVERITY: "How bad is the swelling?" (e.g., localized; mild, moderate, severe)   - Localized: Small area of swelling localized to one leg.   - MILD pedal edema: Swelling limited to foot and ankle, pitting edema < 1/4 inch (6 mm) deep, rest and elevation eliminate most or all swelling.   - MODERATE edema: Swelling of lower leg to knee, pitting edema > 1/4 inch (6 mm) deep, rest and elevation only partially reduce swelling.   - SEVERE edema: Swelling extends above knee, facial or hand swelling present.      Mild 4. REDNESS: "Does the swelling look red or infected?"     no 5. PAIN: "Is the swelling painful to touch?" If Yes, ask: "How painful is it?"    (Scale 1-10; mild, moderate or severe)     5 out of 10 6. FEVER: "Do you have a fever?" If Yes, ask: "What is it, how was it measured, and when did it start?"      no 7. CAUSE: "What do you think is causing the leg swelling?"     unsure 8. MEDICAL HISTORY: "Do you have a history of blood clots (e.g., DVT), cancer, heart failure, kidney disease, or liver failure?"     no 9. RECURRENT SYMPTOM: "Have you had leg swelling before?" If Yes, ask: "When was the last time?" "What happened that time?"     no 10. OTHER SYMPTOMS: "Do you have any other symptoms?" (e.g., chest pain, difficulty breathing)       no  Protocols used: Leg Swelling and Edema-A-AH

## 2023-05-16 NOTE — Telephone Encounter (Signed)
 Patient is scheduled to see the provider on 05/17/2023 at 10:10 AM.

## 2023-05-17 ENCOUNTER — Ambulatory Visit: Admitting: Family Medicine

## 2023-05-17 ENCOUNTER — Ambulatory Visit: Payer: Self-pay | Admitting: *Deleted

## 2023-05-17 NOTE — Telephone Encounter (Signed)
 Message from [place] F sent at 05/17/2023  8:23 AM EDT  Copied From CRM [id number]. Reason for Triage:  Patient called to cancel acute visit scheduled for today; Patient stated she is still experiencing leg swelling but would not allow the agent to get her over to a nurse. Patient would like to see her provider after 3:30pm one day this week except for tomorrow. Please call the patient and attempt to reschedule.  Callback Number: [phone]    Call History  Contact Date/Time Type Contact Phone/Fax By  05/17/2023 08:22 AM EDT Phone (Incoming) Risa, Ball[place] [phone] Angelia Kelp   Reason for Disposition  [1] MODERATE leg swelling (e.g., swelling extends up to knees) AND [2] new-onset or worsening  Answer Assessment - Initial Assessment Questions 1. ONSET: "When did the swelling start?" (e.g., minutes, hours, days)     I called pt at request of Patient Access Specialist because she cancelled her appt for today with Dr. Greer Leak.   Has swelling in left leg.  I let her know we were concerned about this swelling and wanted to get her in to be seen.  (See triage notes from 05/15/2023).   She denies there being any changes since then so did not triage her again, but only wants to see Dr. Greer Leak and it needs to be an afternoon appt.     She only wants to see Dr. Greer Leak and needs an afternoon appt. Due to work.  2. LOCATION: "What part of the leg is swollen?"  "Are both legs swollen or just one leg?"     Left leg.   It's still the same.    She was triaged on 05/15/2023 for same thing.  No further triage done.  See those triage notes. I got her scheduled for 05/18/2023 at 3:40 with Dr. Greer Leak.    3. SEVERITY: "How bad is the swelling?" (e.g., localized; mild, moderate, severe)   - Localized: Small area of swelling localized to one leg.   - MILD pedal edema: Swelling limited to foot and ankle, pitting edema < 1/4 inch (6 mm) deep, rest and elevation eliminate most or all swelling.   - MODERATE  edema: Swelling of lower leg to knee, pitting edema > 1/4 inch (6 mm) deep, rest and elevation only partially reduce swelling.   - SEVERE edema: Swelling extends above knee, facial or hand swelling present.      See triage notes fro 05/15/2023 4. REDNESS: "Does the swelling look red or infected?"      5. PAIN: "Is the swelling painful to touch?" If Yes, ask: "How painful is it?"   (Scale 1-10; mild, moderate or severe)     She mentioned it is sore and was painful when she got a pedicure the other day.  "I almost could not stand for her to touch my let".  Denied it being warmer than other leg.   I went over precautions, signs and symptoms to watch for.   Denies history of blood clots.   Watch for increased swelling, redness or throbbing pain or increased warmth compared to other leg, to go to the ED or call us  back.   Pt. Was agreeable to this plan. 6. FEVER: "Do you have a fever?" If Yes, ask: "What is it, how was it measured, and when did it start?"       7. CAUSE: "What do you think is causing the leg swelling?"     I don't know 8. MEDICAL HISTORY: "Do you have a history  of blood clots (e.g., DVT), cancer, heart failure, kidney disease, or liver failure?"     No 9. RECURRENT SYMPTOM: "Have you had leg swelling before?" If Yes, ask: "When was the last time?" "What happened that time?"     No 10. OTHER SYMPTOMS: "Do you have any other symptoms?" (e.g., chest pain, difficulty breathing)        11. PREGNANCY: "Is there any chance you are pregnant?" "When was your last menstrual period?"  Protocols used: Leg Swelling and Edema-A-AH

## 2023-05-17 NOTE — Telephone Encounter (Signed)
  Chief Complaint: Left leg swelling since 05/13/2023.    See triage notes from 05/15/2023 for this same issue.   Pt called in and cancelled appt for today with Dr Greer Leak.  At request of Patient Access Specialist I called her back to check on her due to the swelling in her left leg.  Symptoms: She denied any changes from  when she was triaged on 05/15/2023. Frequency: Since 05/13/2023 per prior triage notes.   Per pt no changes in condition since then. Pertinent Negatives: Patient denies increased swelling, warmth, or pain Disposition: [] ED /[] Urgent Care (no appt availability in office) / [x] Appointment(In office/virtual)/ []  [facility]/ [] Home Care/ [] Refused Recommended Disposition /[] [facility]/ []  Follow-up with PCP Additional Notes: New appt made with Dr. Greer Leak for 05/18/2023 at 3:40.   She insisted on seeing Dr. Greer Leak only and in the afternoon preferably after 4:00.   Nothing available for that time through June so she was agreeable to the 3:40 appt above.     I went over signs/symptoms she should go to the ED or call us  back if they occur:   increased swelling, throbbing pain, redness, leg is warmer than other leg, shortness of breath or chest pain.

## 2023-05-18 ENCOUNTER — Ambulatory Visit (INDEPENDENT_AMBULATORY_CARE_PROVIDER_SITE_OTHER): Admitting: Family Medicine

## 2023-05-18 VITALS — BP 116/63 | HR 96 | Ht 62.0 in | Wt 176.7 lb

## 2023-05-18 DIAGNOSIS — N6322 Unspecified lump in the left breast, upper inner quadrant: Secondary | ICD-10-CM | POA: Diagnosis not present

## 2023-05-18 DIAGNOSIS — R7989 Other specified abnormal findings of blood chemistry: Secondary | ICD-10-CM

## 2023-05-18 DIAGNOSIS — M7989 Other specified soft tissue disorders: Secondary | ICD-10-CM | POA: Diagnosis not present

## 2023-05-18 NOTE — Assessment & Plan Note (Signed)
 We did discuss the elevation that has been persistent since September.  If you go back several years her typical serum creatinine is right around 0.9.  So it is definitely been a persistent shift but it has been stable over the last 6 months which is really reassuring.  Additional labs and renal ultrasound were reassuring.  Will plan to recheck her renal function in about a month.

## 2023-05-18 NOTE — Progress Notes (Signed)
 Acute Office Visit  Subjective:     Patient ID: Sheryl Taylor, female    DOB: 05-Aug-1970, 53 y.o.   MRN: [id number]  Chief Complaint  Patient presents with   Leg Swelling    L leg she reports that the pain began 5/3 she stated that she has been elevating her leg whenever she can. She denies any injury/trauma. She informed me that her upper thigh is hurting today as she is sitting. No sob/cp    HPI Patient is in today for L leg she reports that the pain began 5/3 she noticed it mostly when she went for her pedicure and they started to massage that left lower leg and it was extremely painful.  It had never happen like that before.  Now she is also having pain in her upper thigh.  She stated that she has been elevating her leg whenever she can. She denies any injury/trauma. She informed me that her upper thigh is hurting today as she is sitting. No sob/cp.  She denies any long car rides etc.  She does feel like that left leg is swollen compared to her right.  Also says a couple of days ago she was wiping something off her chest and felt a little lump in her upper inner quadrant on her breast.  It is not bothersome but it is still there.  She says her mammogram is up-to-date she had it in June of last year with the mammogram bus through [facility] at work.    ROS      Objective:    BP 116/63   Pulse 96   Ht 5\' 2"  (1.575 m)   Wt 176 lb 11.2 oz (80.2 kg)   SpO2 95%   BMI 32.32 kg/m    Physical Exam Vitals reviewed.  Constitutional:      Appearance: Normal appearance.  HENT:     Head: Normocephalic.  Pulmonary:     Effort: Pulmonary effort is normal.  Chest:       Comments: I was able to palpate an approximately 1 cm lump in the upper inner quadrant of the left breast.  No skin changes over the area it feels somewhat cystic in nature Musculoskeletal:     Comments: Leg with some trace swelling in the lower extremity.  Hip, knee, ankle strength is 5 out of 5 bilaterally.  Dorsal  pedal l pulses 2+.  Difficult to palpate the posterior tibial.  She has tenderness with calf squeeze but she is also tender over her shin.  She is tender just behind the knee medially.  Neurological:     Mental Status: She is alert and oriented to person, place, and time.  Psychiatric:        Mood and Affect: Mood normal.        Behavior: Behavior normal.     No results found for any visits on 05/18/23.      Assessment & Plan:   Problem List Items Addressed This Visit       Other   Elevated serum creatinine   We did discuss the elevation that has been persistent since September.  If you go back several years her typical serum creatinine is right around 0.9.  So it is definitely been a persistent shift but it has been stable over the last 6 months which is really reassuring.  Additional labs and renal ultrasound were reassuring.  Will plan to recheck her renal function in about a month.  Other Visit Diagnoses       Left leg swelling    -  Primary   Relevant Orders   D-Dimer, Quantitative     Mass of upper inner quadrant of left breast       Relevant Orders   MM Digital Diagnostic Unilat L   MS US  BREAST LTD UNI LEFT INC AXILLA      Left leg swelling and pain unclear etiology she is overall low risk for DVT so we discussed options including doing a stat D-dimer versus ultrasound she would prefer to do the blood test first.  If negative then will need further workup consider pelvic issues causing some compression.  Again no real trauma or injury to suggest a muscle pull etc.   She did feel a knot in her inner upper quadrant in her left breast a couple of days ago.  Last mammogram was in June of last year.  Will get her scheduled at [facility] for diagnostic imaging and ultrasound.   No orders of the defined types were placed in this encounter.   No follow-ups on file.  Duaine German, MD

## 2023-05-19 ENCOUNTER — Encounter: Payer: Self-pay | Admitting: Family Medicine

## 2023-05-19 DIAGNOSIS — M7989 Other specified soft tissue disorders: Secondary | ICD-10-CM

## 2023-05-19 NOTE — Progress Notes (Signed)
 Hi Amani, great news!  Your D-dimer was negative.  So no indication of a clot.  I know that still does not explain the swelling.  I would like to refer you to one of our vein and vascular specialists.  We could wait until after school gets out if he would prefer to just let me know.  But we may want to go ahead and make the referral now that we can get you scheduled for when is convenient for you.

## 2023-05-20 ENCOUNTER — Other Ambulatory Visit: Payer: Self-pay

## 2023-05-20 ENCOUNTER — Ambulatory Visit
Admission: RE | Admit: 2023-05-20 | Discharge: 2023-05-20 | Disposition: A | Discharge status: Home / Self Care | Source: Ambulatory Visit | Attending: Family Medicine | Admitting: Family Medicine

## 2023-05-20 VITALS — BP 116/69 | HR 97 | Temp 97.6°F | Resp 16

## 2023-05-20 DIAGNOSIS — J4520 Mild intermittent asthma, uncomplicated: Secondary | ICD-10-CM | POA: Diagnosis not present

## 2023-05-20 DIAGNOSIS — Z9109 Other allergy status, other than to drugs and biological substances: Secondary | ICD-10-CM

## 2023-05-20 MED ORDER — METHYLPREDNISOLONE ACETATE 80 MG/ML IJ SUSP
80.0000 mg | Freq: Once | INTRAMUSCULAR | Status: AC
  Administered 2023-05-20: 80 mg via INTRAMUSCULAR

## 2023-05-20 MED ORDER — ALBUTEROL SULFATE (2.5 MG/3ML) 0.083% IN NEBU
2.5000 mg | INHALATION_SOLUTION | RESPIRATORY_TRACT | Status: AC
  Administered 2023-05-20: 2.5 mg via RESPIRATORY_TRACT

## 2023-05-20 NOTE — Discharge Instructions (Signed)
 Continue your fluid days and Zyrtec daily during allergy season Drink lots of fluids See your doctor if not improving by next week

## 2023-05-20 NOTE — ED Provider Notes (Signed)
 [facility]    CSN: [id number] Arrival date & time: 05/20/23  1350      History   Chief Complaint Chief Complaint  Patient presents with   Nasal Congestion    HPI Sheryl Taylor is a 53 y.o. female.   Patient states that she does have allergies.  She periodically takes Flonase and take through the allergy season.  She states that she woke up last night and she felt like she could not breathe.  She states that she woke up several times last night feeling like she could not breathe.  She thinks it may be postnasal drip that is catching in the back of her throat.  She has a cough.  She could hear a "whistle" in her chest.  She wants to be checked to make sure she does not have asthma    Past Medical History:  Diagnosis Date   Asthma    Bronchitis    Hyperlipidemia    Thyroid  nodule     Patient Active Problem List   Diagnosis Date Noted   Elevated serum creatinine 04/25/2023   Abdominal cramping 08/29/2022   Rosacea 06/16/2022   Pleuritic chest pain 05/16/2022   Trochanteric bursitis of left hip 02/21/2022   Plantar wart and corn of right foot 01/28/2022   Subclinical hypothyroidism 09/03/2021   Mass of ankle, left 07/26/2019   Adnexal mass 04/25/2019   Impingement syndrome, shoulder, right 12/17/2018   Radicular pain 10/15/2018   Plantar fasciitis, right 07/29/2016   Lumbar degenerative disc disease 09/26/2014   Strain of left hip adductor muscle 09/26/2014   Tinnitus 06/05/2014   Iron deficiency anemia, unspecified 06/05/2014   Insomnia 06/05/2014   Family history of premature coronary heart disease 07/03/2013    Past Surgical History:  Procedure Laterality Date   CESAREAN SECTION     x 2    CHOLECYSTECTOMY  2003    OB History   No obstetric history on file.      Home Medications    Prior to Admission medications   Medication Sig Start Date End Date Taking? Authorizing Provider  cetirizine (ZYRTEC) 10 MG tablet Take 10 mg by mouth daily.     [provider]  fluticasone (FLONASE ALLERGY RELIEF) 50 MCG/ACT nasal spray Place 1 spray into both nostrils daily.    [provider]  levothyroxine (SYNTHROID) 50 MCG tablet Take 50 mcg by mouth daily. 02/08/23   [provider]    Family History Family History  Problem Relation Age of Onset   Lung cancer Father        lung (50)   Heart failure Father        (14)   Hyperlipidemia Father    CAD Father    Breast cancer Maternal Aunt    Heart attack Paternal Uncle        bypass surgery    Breast cancer Other        great aunt   Breast cancer Other        cousin    Social History Social History   Tobacco Use   Smoking status: Never   Smokeless tobacco: Never  Substance Use Topics   Alcohol use: No    Alcohol/week: 0.0 standard drinks of alcohol   Drug use: No     Allergies   Codeine phosphate, Lansoprazole, Buprenorphine hcl, and Morphine and codeine   Review of Systems Review of Systems See HPI  Physical Exam Triage Vital Signs ED Triage Vitals  Encounter Vitals Group     BP 05/20/23 1355 116/69     Systolic BP Percentile --      Diastolic BP Percentile --      Pulse Rate 05/20/23 1355 97     Resp 05/20/23 1355 16     Temp 05/20/23 1355 97.6 F (36.4 C)     Temp src --      SpO2 05/20/23 1355 94 %     Weight --      Height --      Head Circumference --      Peak Flow --      Pain Score 05/20/23 1358 0     Pain Loc --      Pain Education --      Exclude from Growth Chart --    No data found.  Updated Vital Signs BP 116/69   Pulse 97   Temp 97.6 F (36.4 C)   Resp 16   SpO2 94%       Physical Exam Constitutional:      General: She is not in acute distress.    Appearance: She is well-developed.  HENT:     Head: Normocephalic and atraumatic.     Nose: Congestion and rhinorrhea present.     Mouth/Throat:     Pharynx: No posterior oropharyngeal erythema.  Eyes:     Conjunctiva/sclera: Conjunctivae normal.      Pupils: Pupils are equal, round, and reactive to light.  Cardiovascular:     Rate and Rhythm: Normal rate.  Pulmonary:     Effort: Pulmonary effort is normal. No respiratory distress.     Breath sounds: Wheezing present.     Comments: Initially patient had a tight cough with wheezing.  Occasional inspiration wheeze on auscultation.  After the albuterol  treatment, she had improvement in her breathing.  She states her nasal congestion was better.  On auscultation she did have a few central rhonchi Musculoskeletal:        General: Normal range of motion.     Cervical back: Normal range of motion.  Skin:    General: Skin is warm and dry.  Neurological:     Mental Status: She is alert.      UC Treatments / Results  Labs (all labs ordered are listed, but only abnormal results are displayed) Labs Reviewed - No data to display  EKG   Radiology No results found.  Procedures Procedures (including critical care time)  Medications Ordered in UC Medications  methylPREDNISolone  acetate (DEPO-MEDROL ) injection 80 mg (has no administration in time range)  albuterol  (PROVENTIL ) (2.5 MG/3ML) 0.083% nebulizer solution 2.5 mg (2.5 mg Nebulization Given 05/20/23 1412)    Initial Impression / Assessment and Plan / UC Course  I have reviewed the triage vital signs and the nursing notes.  Pertinent labs & imaging results that were available during my care of the patient were reviewed by me and considered in my medical decision making (see chart for details).     Final Clinical Impressions(s) / UC Diagnoses   Final diagnoses:  Allergy-induced asthma, mild intermittent, uncomplicated  Environmental allergies   Discharge Instructions      Continue your fluid days and Zyrtec daily during allergy season Drink lots of fluids See your doctor if not improving by next week  ED Prescriptions   None    PDMP not reviewed this encounter.   Stephany Ehrich, MD 05/20/23 1430

## 2023-05-20 NOTE — ED Triage Notes (Signed)
 Woke up last night with trouble catching breath, has had nasal congestion, cough. No fever. No otc meds.

## 2023-05-24 ENCOUNTER — Telehealth: Payer: Self-pay | Admitting: *Deleted

## 2023-05-24 DIAGNOSIS — N6322 Unspecified lump in the left breast, upper inner quadrant: Secondary | ICD-10-CM

## 2023-05-24 NOTE — Telephone Encounter (Signed)
Orders Placed This Encounter  Procedures  . Ambulatory referral to Vascular Surgery    Referral Priority:   Routine    Referral Type:   Surgical    Referral Reason:   Specialty Services Required    Requested Specialty:   Vascular Surgery    Number of Visits Requested:   1

## 2023-05-24 NOTE — Telephone Encounter (Signed)
 Copied from CRM [id number]. Topic: Clinical - Request for Lab/Test Order >> May 24, 2023  1:32 PM Shelby Dessert H wrote: Reason for CRM: Christy from [facility] called and stated that an ordered was put in for a MS US  BREAST LTD UNI LEFT INC AXILLA but it needs to be put in as a bilateral diagnostic mammogram instead. Kathaleen Pale can be reached at [phone]

## 2023-05-25 ENCOUNTER — Telehealth: Payer: Self-pay | Admitting: Family Medicine

## 2023-05-25 NOTE — Telephone Encounter (Signed)
 Copied from CRM [id number]. Topic: General - Other >> May 24, 2023  4:36 PM Adrianna P wrote: Reason for CRM: patient called for update on breast ultrasound referral

## 2023-05-25 NOTE — Telephone Encounter (Signed)
 Spoke with patient.

## 2023-05-30 ENCOUNTER — Telehealth: Payer: Self-pay

## 2023-05-30 NOTE — Telephone Encounter (Signed)
 Attempted call to [facility] had to leave a voice mail message requesting a call back

## 2023-05-30 NOTE — Telephone Encounter (Signed)
 Spoke with [facility] . Was missing the corrected order for MMG diagnostic bilateral  Re-faxed this to fax # [phone]

## 2023-05-30 NOTE — Telephone Encounter (Signed)
 Sheryl Taylor, these were sent the day that I saw her in clinic.  Can you call [facility] directly and find out.  Sent to [facility]	[place] in [place].  If they need them recent we certainly can but they were sent the day that we saw her for an office visit which was May 8

## 2023-05-30 NOTE — Telephone Encounter (Signed)
 Copied from CRM [id number]. Topic: Clinical - Request for Lab/Test Order >> May 30, 2023  4:09 PM Kevelyn M wrote: Reason for CRM: Patient called because the orders to have Mammogram and ultrasound have not been sent to [facility]. Please advise patient [phone]

## 2023-06-27 ENCOUNTER — Encounter: Payer: Self-pay | Admitting: Family Medicine

## 2023-06-27 ENCOUNTER — Ambulatory Visit (INDEPENDENT_AMBULATORY_CARE_PROVIDER_SITE_OTHER): Admitting: Family Medicine

## 2023-06-27 ENCOUNTER — Telehealth: Payer: Self-pay | Admitting: Family Medicine

## 2023-06-27 VITALS — BP 125/59 | HR 85 | Ht 62.0 in | Wt 173.0 lb

## 2023-06-27 DIAGNOSIS — L821 Other seborrheic keratosis: Secondary | ICD-10-CM

## 2023-06-27 DIAGNOSIS — L720 Epidermal cyst: Secondary | ICD-10-CM | POA: Diagnosis not present

## 2023-06-27 DIAGNOSIS — Z8249 Family history of ischemic heart disease and other diseases of the circulatory system: Secondary | ICD-10-CM | POA: Diagnosis not present

## 2023-06-27 NOTE — Telephone Encounter (Signed)
 Please call patient and let her know that I did check her chart and she actually did had a calcium score back in 2019.  Dr. Alexandria Angel had actually ordered it and at that time everything really looked great.  Since it has been 6 years since her last test it may be worth repeating that sometime this year or either next year.

## 2023-06-27 NOTE — Telephone Encounter (Signed)
 Called and spoke with pt about info per Dr. Greer Leak and she verbalized understanding. Pt said she wanted to wait until next year to have this retested. Routing to  Dr. Greer Leak as an Arlie Lain.

## 2023-06-27 NOTE — Progress Notes (Addendum)
 Established Patient Office Visit  Subjective  Patient ID: Lanya Bucks, female    DOB: [birth year]  Age: 53 y.o. MRN: [id number]  Chief Complaint  Patient presents with   Nevus    Pt reports that she has noticed a mole on the L side of her forehead near her temple.  She stated that it has been there for a couple of months. It hasn't been itchy or gotten bigger.     HPI   Pt reports that she has noticed a mole on the L side of her forehead near her temple.  She stated that it has been there for a couple of months. It hasn't been itchy or gotten bigger    Also brother at age 20 just passed unexpectantly from heart disease. Her father had bypass surgery at 46 and her dad's brother had by pass in his 59s.    Spot on right facial cheek that has been there efor months.   ROS    Objective:     BP (!) 125/59   Pulse 85   Ht 5' 2 (1.575 m)   Wt 173 lb (78.5 kg)   SpO2 97%   BMI 31.64 kg/m     Physical Exam  Skin:    Comments: Left side of the forehead she has an approximately 4 to 5 mm brown raised waxy appearing lesion. Milia on left facial cheek    Family History  Problem Relation Age of Onset   Lung cancer Father        lung (50)   Heart failure Father        (15)   Hyperlipidemia Father    CAD Father        25   Coronary artery disease Brother    Breast cancer Maternal Aunt    Heart attack Paternal Uncle        bypass surgery    Breast cancer Other        great aunt   Breast cancer Other        cousin      No results found for any visits on 06/27/23.     The 10-year ASCVD risk score (Arnett DK, et al., 2019) is: 1.3%    Assessment & Plan:   Problem List Items Addressed This Visit       Other   Family history of premature coronary heart disease   Look back and she actually had a calcium CT score done back in 2019 score was 0 at that time.  She had this done with Dr. Alexandria Angel.      Other Visit Diagnoses       Seborrheic keratosis    -   Primary     Milia       Relevant Medications   tretinoin (RETIN-A) 0.01 % gel      Cryotherapy Procedure Note  Pre-operative Diagnosis: seb keratosis  Post-operative Diagnosis: same  Locations: left forehead   Anesthesia: none  Procedure Details   Patient informed of risks (permanent scarring, infection, light or dark discoloration, bleeding, infection, weakness, numbness and recurrence of the lesion) and benefits of the procedure and verbal informed consent obtained.  The areas are treated with liquid nitrogen therapy, frozen until ice ball extended 1 mm beyond lesion, allowed to thaw, and treated again. The patient tolerated procedure well.  The patient was instructed on post-op care, warned that there may be redness and pain.  Once the skin starts to peel or slough  okay to use Vaseline.  Recommend OTC analgesia as needed for pain.  Condition: Stable  Complications: none.  Plan: 1. Instructed to keep the area dry and covered for 24-48h and clean thereafter. 2.  Call if any concerns  Return PRN.    No follow-ups on file.    Duaine German, MD

## 2023-06-27 NOTE — Assessment & Plan Note (Signed)
 Look back and she actually had a calcium CT score done back in 2019 score was 0 at that time.  She had this done with Dr. Alexandria Angel.

## 2023-06-28 ENCOUNTER — Telehealth: Payer: Self-pay

## 2023-06-28 MED ORDER — TRETINOIN 0.01 % EX GEL: Freq: Every day | CUTANEOUS | 1 refills | Status: DC

## 2023-06-28 NOTE — Telephone Encounter (Signed)
 Patient informed.

## 2023-06-28 NOTE — Telephone Encounter (Signed)
 Copied from CRM [id number]. Topic: Clinical - Prescription Issue >> Jun 28, 2023 11:17 AM Tisa Forester wrote: Reason for CRM: Patient checking on status of cream for patient:s face that Duaine German suppose to be sending to pharmacy  Please reach out to patient on status  Pt. Call back   [phone]

## 2023-06-28 NOTE — Telephone Encounter (Signed)
 I apologize.  Just sent it over today.  Thank you for reaching out.

## 2023-06-28 NOTE — Addendum Note (Signed)
 Addended by: Pattrick Bady D on: 06/28/2023 04:24 PM   Modules accepted: Orders

## 2023-07-06 ENCOUNTER — Encounter: Payer: Self-pay | Admitting: Family Medicine

## 2023-07-06 NOTE — Telephone Encounter (Signed)
 Please see patient attached message regarding Retin A being too expensive Patient also requesting rx rf of Flonase  Last written by historical provider  Last OV 06/27/2023 Upcoming appt = none

## 2023-07-07 MED ORDER — FLUTICASONE PROPIONATE 50 MCG/ACT NA SUSP: 1.0000 | Freq: Every day | NASAL | 99 refills | Status: AC

## 2023-08-08 LAB — HM MAMMOGRAPHY

## 2023-08-09 ENCOUNTER — Encounter: Payer: Self-pay | Admitting: Family Medicine

## 2023-08-15 ENCOUNTER — Encounter: Payer: Self-pay | Admitting: Family Medicine

## 2023-08-16 NOTE — Telephone Encounter (Signed)
 We can check hr thyroid .  TSH ordered.   We can give her info for [facility] at [place]. Do weknow wha tBMI has to be for referral?

## 2023-08-17 NOTE — Telephone Encounter (Signed)
 Spoke with patient. She will come in for thyroid  levels to be checked. She wants to hold off on the healthy weight and wellness referral for right now. She has taken a lot of time off of work recently and has other home restraints that would make this difficult for her.

## 2023-08-18 ENCOUNTER — Other Ambulatory Visit: Payer: Self-pay

## 2023-08-18 DIAGNOSIS — E038 Other specified hypothyroidism: Secondary | ICD-10-CM

## 2023-08-26 LAB — TSH: TSH: 2.79 u[IU]/mL (ref 0.450–4.500)

## 2023-08-28 ENCOUNTER — Ambulatory Visit: Payer: Self-pay | Admitting: Family Medicine

## 2023-08-28 NOTE — Progress Notes (Signed)
 Your lab work is within acceptable range and there are no concerning findings.   ?

## 2023-09-12 ENCOUNTER — Encounter: Payer: Self-pay | Admitting: Sports Medicine

## 2023-09-13 ENCOUNTER — Encounter: Payer: Self-pay | Admitting: Family Medicine

## 2023-11-02 ENCOUNTER — Encounter: Payer: Self-pay | Admitting: Family Medicine

## 2023-11-02 DIAGNOSIS — E038 Other specified hypothyroidism: Secondary | ICD-10-CM

## 2023-11-03 NOTE — Telephone Encounter (Signed)
 Doesn't need appt can come get lab done

## 2024-01-02 ENCOUNTER — Ambulatory Visit: Admitting: Family Medicine

## 2024-01-02 ENCOUNTER — Encounter: Payer: Self-pay | Admitting: Family Medicine

## 2024-01-02 VITALS — BP 125/54 | HR 77 | Ht 62.0 in | Wt 171.0 lb

## 2024-01-02 DIAGNOSIS — M7062 Trochanteric bursitis, left hip: Secondary | ICD-10-CM | POA: Diagnosis not present

## 2024-01-02 DIAGNOSIS — M7061 Trochanteric bursitis, right hip: Secondary | ICD-10-CM

## 2024-01-02 DIAGNOSIS — M79605 Pain in left leg: Secondary | ICD-10-CM

## 2024-01-02 DIAGNOSIS — L821 Other seborrheic keratosis: Secondary | ICD-10-CM | POA: Diagnosis not present

## 2024-01-02 DIAGNOSIS — M79672 Pain in left foot: Secondary | ICD-10-CM

## 2024-01-02 MED ORDER — CELECOXIB 100 MG PO CAPS: 100.0000 mg | ORAL_CAPSULE | Freq: Two times a day (BID) | ORAL | 1 refills | Status: AC | PRN

## 2024-01-02 NOTE — Progress Notes (Signed)
 Pt reports that she has been experiencing pain in her legs,ankles and feet for several months.  She said that on this past Saturday she was out shopping and it got so bad that she couldn't bear weight on her L ankle. 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

## 2024-01-02 NOTE — Progress Notes (Signed)
 "  Acute Office Visit  Patient ID: Sheryl Taylor, female    DOB: 1970/10/26, 53 y.o.   MRN: [id number]  PCP: Alvan Dorothyann BIRCH, MD  Chief Complaint  Patient presents with   Leg Pain   Ankle Pain   feet pain    Subjective:     HPI  Discussed the use of AI scribe software for clinical note transcription with the patient, who gave verbal consent to proceed.  History of Present Illness Addaline Peplinski is a 53 year old female who presents with severe foot pain and leg discomfort.  Foot pain and swelling - Severe pain primarily located in the ball of the foot - Pain intensity increased recently, making ambulation and weight-bearing difficult for approximately thirty minutes before subsiding last weekend - Pain persists with walking and is described as 'hurting like crap' - Foot is consistently slightly swollen, not more than usual.  - No tenderness to palpation - No recent trauma or twisting injury - History of surgery on the affected side - Previously received a cortisone injection for plantar fasciitis, which was very painful  Leg discomfort and aching - Aching sensation in the legs, particularly in the anterior aspect - Described as a 'weird' sensation - Legs remain sore during activities such as pedicures - Previous evaluation for blood clots was negative  Hip pain - Difficulty sleeping due to hip pain - History of bursitis and arthritis affecting the hip  Back pain - History of back pain - Previously received an injection for back pain with only temporary relief  Medication intolerance - Aleve  and ibuprofen  used sparingly due to stomach discomfort  Thyroid  dysfunction - History of thyroid  issues - Recent thyroid  level assessment  Work-related stress - Currently experiencing increased work stress and performing double duties - Difficulty attending medical appointments due to work demands  Vitamin b12 status - Previous B12 level assessment, timing unclear  Check  spot on chest, no change, skin colored lesion.    ROS     Objective:    BP (!) 125/54   Pulse 77   Ht 5' 2 (1.575 m)   Wt 171 lb (77.6 kg)   SpO2 98%   BMI 31.28 kg/m    Physical Exam Musculoskeletal:     Comments: Mild tendernss over lower calf on left. Tender over the shin, tender behind the medial malleolus.  DP and post tib pulse 2+.  No bruising . Trace edema around the ankle.  Tender on the ball fhe foot.         No results found for any visits on 01/02/24.     Assessment & Plan:   Problem List Items Addressed This Visit   None Visit Diagnoses       Left leg pain    -  Primary   Relevant Orders   CBC with Differential/Platelet   CMP14+EGFR   B12   Vitamin B1     Left foot pain       Relevant Orders   CBC with Differential/Platelet   CMP14+EGFR   B12   Vitamin B1     Trochanteric bursitis of both hips       Relevant Medications   celecoxib  (CELEBREX ) 100 MG capsule     Seborrheic keratosis           Assessment and Plan Assessment & Plan Chronic bilateral lower extremity pain and swelling Chronic pain and swelling in lower extremities, more pronounced in left foot. Differential includes nerve irritation, electrolyte imbalance,  thyroid  dysfunction, and low B12 levels. Unlikely related to previous tumor removal. Pain not consistent with plantar fasciitis. - Ordered blood work for electrolytes, thyroid  function, and B12 levels. - Recommended knee-high compression stockings during the day. - Suggested periodic leg elevation using a small step.  Chronic hip pain due to bursitis and osteoarthritis Chronic hip pain affecting sleep, previously managed with cortisone injection with limited relief. NSAIDs cause gastrointestinal discomfort. Celebrex  considered for gentler stomach profile. - Prescribed Celebrex , 30 tablets, to be taken before bedtime or after dinner.  Suspected seborrheic keratosis versus early skin neoplasm Lesion likely seborrheic  keratosis, stable in size and appearance, suggesting benign nature. - Prescribed topical cream to flatten and reduce lesion, with caution for irritation and redness.    Meds ordered this encounter  Medications   celecoxib  (CELEBREX ) 100 MG capsule    Sig: Take 1 capsule (100 mg total) by mouth 2 (two) times daily as needed.    Dispense:  60 capsule    Refill:  1    No follow-ups on file.  Dorothyann Byars, MD [facility] at [facility]   "

## 2024-01-03 LAB — TSH+FREE T4
Free T4: 0.93 ng/dL (ref 0.82–1.77)
TSH: 4.47 u[IU]/mL (ref 0.450–4.500)

## 2024-01-05 ENCOUNTER — Ambulatory Visit: Payer: Self-pay | Admitting: Family Medicine

## 2024-01-05 ENCOUNTER — Other Ambulatory Visit: Payer: Self-pay | Admitting: *Deleted

## 2024-01-05 ENCOUNTER — Ambulatory Visit: Payer: Self-pay

## 2024-01-05 MED ORDER — LEVOTHYROXINE SODIUM 50 MCG PO TABS: 50.0000 ug | ORAL_TABLET | Freq: Every day | ORAL | 0 refills | Status: AC

## 2024-01-05 NOTE — Progress Notes (Signed)
 Labs normal so far. One pending. I would like for you to consider seeing our sports med doc in [facility].

## 2024-01-05 NOTE — Progress Notes (Signed)
 Your thyroid  level shifted slightly. Plan to recheck again in 3 months.  Make sure taking on empty stomach at least an hour before food and 4 hours from vitamins or supplements.

## 2024-01-06 ENCOUNTER — Other Ambulatory Visit: Payer: Self-pay

## 2024-01-06 ENCOUNTER — Ambulatory Visit

## 2024-01-06 ENCOUNTER — Ambulatory Visit
Admission: RE | Admit: 2024-01-06 | Discharge: 2024-01-06 | Disposition: A | Discharge status: Home / Self Care | Payer: Self-pay | Source: Ambulatory Visit | Attending: Family Medicine | Admitting: Family Medicine

## 2024-01-06 VITALS — BP 108/71 | HR 93 | Temp 98.4°F | Resp 14

## 2024-01-06 DIAGNOSIS — S4991XA Unspecified injury of right shoulder and upper arm, initial encounter: Secondary | ICD-10-CM

## 2024-01-06 DIAGNOSIS — S60911A Unspecified superficial injury of right wrist, initial encounter: Secondary | ICD-10-CM

## 2024-01-06 DIAGNOSIS — M79601 Pain in right arm: Secondary | ICD-10-CM

## 2024-01-06 MED ORDER — HYDROCODONE-ACETAMINOPHEN 7.5-325 MG PO TABS: 1.0000 | ORAL_TABLET | Freq: Four times a day (QID) | ORAL | 0 refills | Status: AC | PRN

## 2024-01-06 NOTE — Discharge Instructions (Signed)
 Wear brace and use sling for the next couple of days to limit use of arm Put ice for 20 minutes every couple of hours Take Tylenol  or ibuprofen  for mild to moderate pain Take hydrocodone  if needed for severe pain See your doctor if not improving by next week

## 2024-01-06 NOTE — ED Triage Notes (Signed)
 Patient reports that she missed a step going into a back door to her house 2 days ago. Patient c/o pain to the distal right arm.   Patient has beenb taking Ibuprofen  600 mg for pain.

## 2024-01-06 NOTE — ED Provider Notes (Signed)
 " [facility]    CSN: [id number] Arrival date & time: 01/06/24  1422      History   Chief Complaint Chief Complaint  Patient presents with   Arm Injury    Entered by patient    HPI Sheryl Taylor is a 53 y.o. female.   HPI Patient fell walking through a doorway and hit her right arm forcibly.  There is a bruise.  Arm is very painful. Past Medical History:  Diagnosis Date   Asthma    Bronchitis    Hyperlipidemia    Thyroid  nodule     Patient Active Problem List   Diagnosis Date Noted   Elevated serum creatinine 04/25/2023   Abdominal cramping 08/29/2022   Rosacea 06/16/2022   Pleuritic chest pain 05/16/2022   Trochanteric bursitis of left hip 02/21/2022   Plantar wart and corn of right foot 01/28/2022   Subclinical hypothyroidism 09/03/2021   Mass of ankle, left 07/26/2019   Adnexal mass 04/25/2019   Impingement syndrome, shoulder, right 12/17/2018   Radicular pain 10/15/2018   Plantar fasciitis, right 07/29/2016   Lumbar degenerative disc disease 09/26/2014   Strain of left hip adductor muscle 09/26/2014   Tinnitus 06/05/2014   Iron deficiency anemia, unspecified 06/05/2014   Insomnia 06/05/2014   Family history of premature coronary heart disease 07/03/2013    Past Surgical History:  Procedure Laterality Date   CESAREAN SECTION     x 2    CHOLECYSTECTOMY  2003    OB History   No obstetric history on file.      Home Medications    Prior to Admission medications  Medication Sig Start Date End Date Taking? Authorizing Provider  HYDROcodone -acetaminophen  (NORCO) 7.5-325 MG tablet Take 1 tablet by mouth every 6 (six) hours as needed for moderate pain (pain score 4-6). 01/06/24  Yes Maranda Jamee Jacob, MD  celecoxib  (CELEBREX ) 100 MG capsule Take 1 capsule (100 mg total) by mouth 2 (two) times daily as needed. 01/02/24   Alvan Dorothyann BIRCH, MD  cetirizine (ZYRTEC) 10 MG tablet Take 10 mg by mouth daily.    [provider]   fluticasone  (FLONASE  ALLERGY RELIEF) 50 MCG/ACT nasal spray Place 1 spray into both nostrils daily. 07/07/23   Alvan Dorothyann BIRCH, MD  levothyroxine  (SYNTHROID ) 50 MCG tablet Take 1 tablet (50 mcg total) by mouth daily. Patient not taking: Reported on 01/06/2024 01/05/24   Alvan Dorothyann BIRCH, MD    Family History Family History  Problem Relation Age of Onset   Lung cancer Father        lung (50)   Heart failure Father        (41)   Hyperlipidemia Father    CAD Father        34   Coronary artery disease Brother    Breast cancer Maternal Aunt    Heart attack Paternal Uncle        bypass surgery    Breast cancer Other        great aunt   Breast cancer Other        cousin    Social History Social History[1]   Allergies   Codeine phosphate, Lansoprazole, Buprenorphine hcl, and Morphine and codeine   Review of Systems Review of Systems  See HPI Physical Exam Triage Vital Signs ED Triage Vitals [01/06/24 1446]  Encounter Vitals Group     BP 108/71     Girls Systolic BP Percentile      Girls Diastolic BP Percentile  Boys Systolic BP Percentile      Boys Diastolic BP Percentile      Pulse Rate 93     Resp 14     Temp 98.4 F (36.9 C)     Temp Source Oral     SpO2 96 %     Weight      Height      Head Circumference      Peak Flow      Pain Score 7     Pain Loc      Pain Education      Exclude from Growth Chart    No data found.  Updated Vital Signs BP 108/71 (BP Location: Left Arm)   Pulse 93   Temp 98.4 F (36.9 C) (Oral)   Resp 14   SpO2 96%        Physical Exam Constitutional:      General: She is not in acute distress.    Appearance: She is well-developed.  HENT:     Head: Normocephalic and atraumatic.  Eyes:     Conjunctiva/sclera: Conjunctivae normal.     Pupils: Pupils are equal, round, and reactive to light.  Cardiovascular:     Rate and Rhythm: Normal rate.  Pulmonary:     Effort: Pulmonary effort is normal. No respiratory  distress.  Musculoskeletal:        General: Swelling, tenderness and signs of injury present. Normal range of motion.     Right forearm: Swelling and tenderness present.       Arms:     Cervical back: Normal range of motion.     Comments: There is some bruising on the dorsum of the right forearm, distal radius and ulna.  Tenderness throughout forearm most pronounced distally.  No tenderness of elbow.  No scaphoid tenderness.  Skin:    General: Skin is warm and dry.  Neurological:     Mental Status: She is alert.      UC Treatments / Results  Labs (all labs ordered are listed, but only abnormal results are displayed) Labs Reviewed - No data to display  EKG   Radiology DG Wrist Complete Right Result Date: 01/06/2024 CLINICAL DATA:  Wrist injury/distal forearm. Fall down steps yesterday. EXAM: RIGHT WRIST - COMPLETE 3+ VIEW COMPARISON:  None Available. FINDINGS: Permanent jewelry was unable to be removed. Allowing for this, there is no evidence of fracture or dislocation. Alignment and joint spaces are normal. Small lucent lesion within the fourth metacarpal shaft has narrow zone of transition and is of doubtful clinical significance. Soft tissues are unremarkable. IMPRESSION: No fracture or dislocation of the right wrist. Electronically Signed   By: Andrea Gasman M.D.   On: 01/06/2024 15:59    Procedures Procedures (including critical care time)  Medications Ordered in UC Medications - No data to display  Initial Impression / Assessment and Plan / UC Course  I have reviewed the triage vital signs and the nursing notes.  Pertinent labs & imaging results that were available during my care of the patient were reviewed by me and considered in my medical decision making (see chart for details).    Discussed that x-rays are negative. Final Clinical Impressions(s) / UC Diagnoses   Final diagnoses:  Injury of right upper extremity, initial encounter  Right arm pain      Discharge Instructions      Wear brace and use sling for the next couple of days to limit use of arm Put ice for  20 minutes every couple of hours Take Tylenol  or ibuprofen  for mild to moderate pain Take hydrocodone  if needed for severe pain See your doctor if not improving by next week   ED Prescriptions     Medication Sig Dispense Auth. Provider   HYDROcodone -acetaminophen  (NORCO) 7.5-325 MG tablet Take 1 tablet by mouth every 6 (six) hours as needed for moderate pain (pain score 4-6). 10 tablet Maranda Jamee Jacob, MD      I have reviewed the PDMP during this encounter.    [1]  Social History Tobacco Use   Smoking status: Never   Smokeless tobacco: Never  Vaping Use   Vaping status: Never Used  Substance Use Topics   Alcohol use: No    Alcohol/week: 0.0 standard drinks of alcohol   Drug use: No     Maranda Jamee Jacob, MD 01/06/24 1648  "

## 2024-01-06 NOTE — ED Notes (Signed)
 Patient refused shoulder immobilizer/sling

## 2024-01-08 ENCOUNTER — Telehealth: Payer: Self-pay

## 2024-01-08 LAB — CBC WITH DIFFERENTIAL/PLATELET
Basophils Absolute: 0 x10E3/uL (ref 0.0–0.2)
Basos: 1 %
EOS (ABSOLUTE): 0.1 x10E3/uL (ref 0.0–0.4)
Eos: 1 %
Hematocrit: 45 % (ref 34.0–46.6)
Hemoglobin: 14.5 g/dL (ref 11.1–15.9)
Immature Grans (Abs): 0 x10E3/uL (ref 0.0–0.1)
Immature Granulocytes: 0 %
Lymphocytes Absolute: 2.5 x10E3/uL (ref 0.7–3.1)
Lymphs: 30 %
MCH: 29.2 pg (ref 26.6–33.0)
MCHC: 32.2 g/dL (ref 31.5–35.7)
MCV: 91 fL (ref 79–97)
Monocytes Absolute: 0.7 x10E3/uL (ref 0.1–0.9)
Monocytes: 9 %
Neutrophils Absolute: 5 x10E3/uL (ref 1.4–7.0)
Neutrophils: 59 %
Platelets: 327 x10E3/uL (ref 150–450)
RBC: 4.97 x10E6/uL (ref 3.77–5.28)
RDW: 12.6 % (ref 11.7–15.4)
WBC: 8.4 x10E3/uL (ref 3.4–10.8)

## 2024-01-08 LAB — CMP14+EGFR
ALT: 28 IU/L (ref 0–32)
AST: 18 IU/L (ref 0–40)
Albumin: 4.4 g/dL (ref 3.8–4.9)
Alkaline Phosphatase: 106 IU/L (ref 49–135)
BUN/Creatinine Ratio: 11 (ref 9–23)
BUN: 10 mg/dL (ref 6–24)
Bilirubin Total: 0.3 mg/dL (ref 0.0–1.2)
CO2: 22 mmol/L (ref 20–29)
Calcium: 9.9 mg/dL (ref 8.7–10.2)
Chloride: 104 mmol/L (ref 96–106)
Creatinine, Ser: 0.92 mg/dL (ref 0.57–1.00)
Globulin, Total: 2.4 g/dL (ref 1.5–4.5)
Glucose: 87 mg/dL (ref 70–99)
Potassium: 4.7 mmol/L (ref 3.5–5.2)
Sodium: 143 mmol/L (ref 134–144)
Total Protein: 6.8 g/dL (ref 6.0–8.5)
eGFR: 75 mL/min/1.73

## 2024-01-08 LAB — VITAMIN B1: Thiamine: 118.3 nmol/L (ref 66.5–200.0)

## 2024-01-08 LAB — VITAMIN B12: Vitamin B-12: 359 pg/mL (ref 232–1245)

## 2024-01-08 NOTE — Progress Notes (Signed)
 Hi Kadeshia, great news your labs overall look great no concerning findings no deficiency.  I can always get you in with her podiatrist if you continue to have foot pain Dr. Joya is great and she comes on Fridays.  Just let me know if you decide you want to proceed with consultation with her

## 2024-01-09 NOTE — Progress Notes (Signed)
 That is a pretty big bruise!  Hopefully you are feeling a little better and glad nothing was fractured.  You do have a little bubble or cyst in the bone in your finger.  I would like to keep an eye on it and maybe plan to repeat an x-ray in about 3 to 4 months just to make sure it does not look like it is changing but it is probably completely benign some people get these and they are normal.

## 2024-01-12 NOTE — Telephone Encounter (Signed)
 repeat an x-ray in about 3 to 4 months

## 2024-02-04 ENCOUNTER — Encounter: Payer: Self-pay | Admitting: Family Medicine

## 2024-02-05 ENCOUNTER — Telehealth (INDEPENDENT_AMBULATORY_CARE_PROVIDER_SITE_OTHER): Admitting: Family Medicine

## 2024-02-05 ENCOUNTER — Encounter: Payer: Self-pay | Admitting: Family Medicine

## 2024-02-05 DIAGNOSIS — B9689 Other specified bacterial agents as the cause of diseases classified elsewhere: Secondary | ICD-10-CM

## 2024-02-05 DIAGNOSIS — J019 Acute sinusitis, unspecified: Secondary | ICD-10-CM | POA: Diagnosis not present

## 2024-02-05 MED ORDER — PREDNISONE 20 MG PO TABS: 40.0000 mg | ORAL_TABLET | Freq: Every day | ORAL | 0 refills | Status: AC

## 2024-02-05 MED ORDER — AMOXICILLIN-POT CLAVULANATE 875-125 MG PO TABS: 1.0000 | ORAL_TABLET | Freq: Two times a day (BID) | ORAL | 0 refills | Status: AC

## 2024-02-05 NOTE — Progress Notes (Signed)
 Spoke w/pt she has been taking nyquil dayquil, she did have a temp the highest was 100.3 this was last Thursday. She hasn't done any covid testing.  She reports that its in her head she is having a lot of pressure

## 2024-02-05 NOTE — Progress Notes (Signed)
 "                    MyChart Video Visit    Virtual Visit via Video Note   This format is felt to be most appropriate for this patient at this time. Physical exam was limited by quality of the video and audio technology used for the visit.    Patient location: home Provider location: [facility] Persons involved in the visit: patient, provider  I discussed the limitations of evaluation and management by telemedicine and the availability of in person appointments. The patient expressed understanding and agreed to proceed.  Patient: Sheryl Taylor   DOB: 07-Sep-1970   54 y.o. Female  MRN: [id number] Visit Date: 02/05/2024  Today's healthcare provider: Dorothyann Byars, MD   No chief complaint on file.   Subjective:    HPI  Nasal congestion and cough that started about 8 days ago Dx with flu A. Opted not to do Tamiflu . Now with severe bilateral ( thought worse on left side) facial pain pressure and congestion. Still using her flonase  and zyrtec. Also taking Dayquil and Nyquil.  Has been running low grade emp over the weekend. No GI sxs.  Missed work Friday.  Voice is raspy.   Cough and congestion keep ing her up at night. Feels she is at the point needs a steroid.    ROS        Objective:    There were no vitals taken for this visit.       Physical Exam Vitals reviewed.  Constitutional:      Appearance: Normal appearance.  HENT:     Head: Normocephalic.  Pulmonary:     Effort: Pulmonary effort is normal.  Neurological:     Mental Status: She is alert and oriented to person, place, and time.  Psychiatric:        Mood and Affect: Mood normal.        Behavior: Behavior normal.         Assessment & Plan:    Problem List Items Addressed This Visit   None Visit Diagnoses       Acute bacterial sinusitis    -  Primary   Relevant Medications   amoxicillin -clavulanate (AUGMENTIN ) 875-125 MG tablet    predniSONE  (DELTASONE ) 20 MG tablet      Started with flu, now with secondary sinus infection, bacterial. Will tx with augment I and prednisone .  Call if not better by end of week. Continue sxs care.  Try to clear sinuses with saline and hot shower, etc.     Meds ordered this encounter  Medications   amoxicillin -clavulanate (AUGMENTIN ) 875-125 MG tablet    Sig: Take 1 tablet by mouth 2 (two) times daily.    Dispense:  14 tablet    Refill:  0   predniSONE  (DELTASONE ) 20 MG tablet    Sig: Take 2 tablets (40 mg total) by mouth daily with breakfast.    Dispense:  10 tablet    Refill:  0     No follow-ups on file.     I discussed the assessment and treatment plan with the patient. The patient was provided an opportunity to ask questions and all were answered. The patient agreed with the plan and demonstrated an understanding of the instructions.   The patient was advised to call back or seek an in-person evaluation if the symptoms worsen or if the condition fails to improve as  anticipated.  I provided 10 minutes of non-face-to-face time during this encounter.  Dorothyann Byars, MD [facility] at [facility]    "

## 2024-02-05 NOTE — Telephone Encounter (Signed)
 Appt made for patient
